# Patient Record
Sex: Female | Born: 1955
Health system: Southern US, Community
[De-identification: ages and names within clinical notes are randomized; demographics above are authoritative.]

## PROBLEM LIST (undated history)

## (undated) DIAGNOSIS — G2581 Restless legs syndrome: Secondary | ICD-10-CM

## (undated) DIAGNOSIS — I10 Essential (primary) hypertension: Secondary | ICD-10-CM

## (undated) DIAGNOSIS — T7840XA Allergy, unspecified, initial encounter: Secondary | ICD-10-CM

## (undated) DIAGNOSIS — R519 Headache, unspecified: Secondary | ICD-10-CM

## (undated) DIAGNOSIS — R51 Headache: Secondary | ICD-10-CM

## (undated) HISTORY — DX: Essential (primary) hypertension: I10

## (undated) HISTORY — DX: Allergy, unspecified, initial encounter: T78.40XA

## (undated) HISTORY — DX: Headache: R51

## (undated) HISTORY — DX: Headache, unspecified: R51.9

## (undated) HISTORY — DX: Restless legs syndrome: G25.81

---

## 2003-03-16 HISTORY — PX: ENDOMETRIAL BIOPSY: SHX622

## 2007-03-16 HISTORY — PX: KNEE ARTHROSCOPY: SUR90

## 2013-05-13 HISTORY — PX: TEMPORAL ARTERY BIOPSY / LIGATION: SUR132

## 2014-03-15 HISTORY — PX: CATARACT EXTRACTION: SUR2

## 2014-07-19 ENCOUNTER — Ambulatory Visit
Admission: RE | Admit: 2014-07-19 | Discharge: 2014-07-19 | Disposition: A | Discharge status: Home / Self Care | Payer: BLUE CROSS/BLUE SHIELD | Source: Ambulatory Visit | Attending: Internal Medicine | Admitting: Internal Medicine

## 2014-07-19 ENCOUNTER — Other Ambulatory Visit: Payer: Self-pay | Admitting: Internal Medicine

## 2014-07-19 DIAGNOSIS — M25552 Pain in left hip: Secondary | ICD-10-CM | POA: Diagnosis present

## 2014-07-19 DIAGNOSIS — M4698 Unspecified inflammatory spondylopathy, sacral and sacrococcygeal region: Secondary | ICD-10-CM | POA: Insufficient documentation

## 2014-10-02 ENCOUNTER — Encounter: Payer: Self-pay | Admitting: Internal Medicine

## 2014-12-19 ENCOUNTER — Other Ambulatory Visit: Payer: Self-pay | Admitting: Internal Medicine

## 2014-12-31 ENCOUNTER — Other Ambulatory Visit: Payer: Self-pay | Admitting: Internal Medicine

## 2014-12-31 ENCOUNTER — Encounter: Payer: Self-pay | Admitting: Internal Medicine

## 2014-12-31 DIAGNOSIS — I10 Essential (primary) hypertension: Secondary | ICD-10-CM | POA: Insufficient documentation

## 2014-12-31 DIAGNOSIS — G4730 Sleep apnea, unspecified: Secondary | ICD-10-CM | POA: Insufficient documentation

## 2014-12-31 DIAGNOSIS — G43009 Migraine without aura, not intractable, without status migrainosus: Secondary | ICD-10-CM | POA: Insufficient documentation

## 2014-12-31 DIAGNOSIS — M179 Osteoarthritis of knee, unspecified: Secondary | ICD-10-CM | POA: Insufficient documentation

## 2014-12-31 DIAGNOSIS — M722 Plantar fascial fibromatosis: Secondary | ICD-10-CM | POA: Insufficient documentation

## 2014-12-31 DIAGNOSIS — E785 Hyperlipidemia, unspecified: Secondary | ICD-10-CM

## 2014-12-31 DIAGNOSIS — R739 Hyperglycemia, unspecified: Secondary | ICD-10-CM

## 2014-12-31 DIAGNOSIS — M25559 Pain in unspecified hip: Secondary | ICD-10-CM | POA: Insufficient documentation

## 2014-12-31 DIAGNOSIS — R42 Dizziness and giddiness: Secondary | ICD-10-CM | POA: Insufficient documentation

## 2014-12-31 DIAGNOSIS — M1710 Unilateral primary osteoarthritis, unspecified knee: Secondary | ICD-10-CM | POA: Insufficient documentation

## 2014-12-31 DIAGNOSIS — T7840XA Allergy, unspecified, initial encounter: Secondary | ICD-10-CM | POA: Insufficient documentation

## 2014-12-31 DIAGNOSIS — R7303 Prediabetes: Secondary | ICD-10-CM | POA: Insufficient documentation

## 2014-12-31 DIAGNOSIS — G4761 Periodic limb movement disorder: Secondary | ICD-10-CM | POA: Insufficient documentation

## 2014-12-31 DIAGNOSIS — I700 Atherosclerosis of aorta: Secondary | ICD-10-CM | POA: Insufficient documentation

## 2015-01-03 ENCOUNTER — Ambulatory Visit: Payer: Self-pay | Admitting: Internal Medicine

## 2015-01-04 ENCOUNTER — Other Ambulatory Visit: Payer: Self-pay | Admitting: Internal Medicine

## 2015-01-08 ENCOUNTER — Encounter: Payer: Self-pay | Admitting: Internal Medicine

## 2015-01-08 ENCOUNTER — Ambulatory Visit (INDEPENDENT_AMBULATORY_CARE_PROVIDER_SITE_OTHER): Payer: BLUE CROSS/BLUE SHIELD | Admitting: Internal Medicine

## 2015-01-08 VITALS — BP 110/60 | HR 68 | Temp 98.1°F | Ht 64.0 in | Wt 227.8 lb

## 2015-01-08 DIAGNOSIS — M170 Bilateral primary osteoarthritis of knee: Secondary | ICD-10-CM | POA: Diagnosis not present

## 2015-01-08 DIAGNOSIS — J019 Acute sinusitis, unspecified: Secondary | ICD-10-CM

## 2015-01-08 DIAGNOSIS — I10 Essential (primary) hypertension: Secondary | ICD-10-CM | POA: Diagnosis not present

## 2015-01-08 MED ORDER — HYDROCHLOROTHIAZIDE 25 MG PO TABS: 25.0000 mg | ORAL_TABLET | Freq: Every day | ORAL | Status: DC

## 2015-01-08 MED ORDER — FEXOFENADINE HCL 180 MG PO TABS: 180.0000 mg | ORAL_TABLET | Freq: Every day | ORAL | Status: DC

## 2015-01-08 MED ORDER — FLUTICASONE PROPIONATE 50 MCG/ACT NA SUSP: 2.0000 | Freq: Every day | NASAL | Status: DC

## 2015-01-08 MED ORDER — AMOXICILLIN-POT CLAVULANATE 875-125 MG PO TABS: 1.0000 | ORAL_TABLET | Freq: Two times a day (BID) | ORAL | Status: DC

## 2015-01-08 NOTE — Progress Notes (Signed)
Date:  01/08/2015   Name:  Alicia Andersen   DOB:  07-20-1955   MRN:  [id number]   Chief Complaint: Hypertension and Sinusitis Hypertension This is a chronic problem. The current episode started more than 1 year ago. The problem is unchanged. The problem is controlled. Pertinent negatives include no chest pain, headaches, palpitations or shortness of breath. Past treatments include diuretics and beta blockers. There are no compliance problems.   Sinusitis This is a new problem. The current episode started in the past 7 days. The problem has been gradually worsening since onset. There has been no fever. Associated symptoms include congestion, ear pain, a hoarse voice, sinus pressure and a sore throat. Pertinent negatives include no chills, coughing, diaphoresis, headaches or shortness of breath.   Knee pain -Patient complains of ongoing bilateral knee pain. She has popping and cracking in the right knee and discomfort in  the left knee with range of motion. She's been seen by orthopedics. They recommended ibuprofen 600 mg twice a day. When she takes that she does get some relief but it does not last. Unfortunately her job requires her to stand for 8 hours on concrete with only a few brief breaks. She does wear steel toed shoes but has cushion inserts. She is asking for additional medications to help control her pain.    Review of Systems  Constitutional: Negative for fever, chills, diaphoresis and fatigue.  HENT: Positive for congestion, ear pain, hoarse voice, postnasal drip, sinus pressure, sore throat and voice change. Negative for hearing loss, nosebleeds and trouble swallowing.   Eyes: Negative for visual disturbance.  Respiratory: Negative for cough, chest tightness and shortness of breath.   Cardiovascular: Positive for leg swelling. Negative for chest pain and palpitations.  Gastrointestinal: Negative for abdominal pain.  Musculoskeletal: Positive for myalgias and arthralgias.  Skin:  Negative for rash.  Neurological: Negative for weakness, light-headedness and headaches.    Patient Active Problem List   Diagnosis Date Noted  . Plantar fasciitis 12/31/2014  . Dyslipidemia 12/31/2014  . Allergic state 12/31/2014  . Essential (primary) hypertension 12/31/2014  . Arthralgia of hip 12/31/2014  . Blood glucose elevated 12/31/2014  . Migraine without aura and responsive to treatment 12/31/2014  . Arthritis of knee, degenerative 12/31/2014  . Periodic limb movement 12/31/2014  . Apnea, sleep 12/31/2014  . Head revolving around 12/31/2014    Prior to Admission medications   Medication Sig Start Date End Date Taking? Authorizing Provider  amitriptyline (ELAVIL) 50 MG tablet Take 1 tablet by mouth at bedtime. 12/14/13  Yes Historical Provider, MD  fexofenadine (ALLEGRA ALLERGY) 180 MG tablet Take by mouth. 07/19/14  Yes Historical Provider, MD  fluticasone (FLONASE) 50 MCG/ACT nasal spray Place into the nose. 06/06/14  Yes Historical Provider, MD  hydrochlorothiazide (HYDRODIURIL) 25 MG tablet TAKE ONE TABLET BY MOUTH ONCE DAILY 01/04/15  Yes Glean Hess, MD  ibuprofen (ADVIL,MOTRIN) 600 MG tablet Take by mouth. 07/19/14  Yes Historical Provider, MD  pramipexole (MIRAPEX) 0.25 MG tablet Take 1 tablet by mouth at bedtime. 12/14/13  Yes Historical Provider, MD  propranolol ER (INDERAL LA) 80 MG 24 hr capsule TAKE ONE CAPSULE BY MOUTH ONCE DAILY 12/19/14  Yes Glean Hess, MD  SUMAtriptan (IMITREX) 100 MG tablet Take 1 tablet by mouth daily as needed. 12/14/13  Yes Historical Provider, MD  meclizine (ANTIVERT) 25 MG tablet Take by mouth. 02/24/12   Historical Provider, MD    No Known Allergies  Past Surgical History  Procedure Laterality  Date  . Cataract extraction    . Knee arthroscopy Right 2009    Social History  Substance Use Topics  . Smoking status: Never Smoker   . Smokeless tobacco: None  . Alcohol Use: No     Medication list has been reviewed and  updated.   Physical Exam  Constitutional: She appears well-developed and well-nourished.  HENT:  Right Ear: Tympanic membrane is erythematous and retracted.  Left Ear: Tympanic membrane is not erythematous and not retracted.  Nose: Right sinus exhibits maxillary sinus tenderness and frontal sinus tenderness. Left sinus exhibits maxillary sinus tenderness and frontal sinus tenderness.  Mouth/Throat: Uvula is midline and oropharynx is clear and moist. No posterior oropharyngeal erythema.  Neck: Normal range of motion. Neck supple. No thyromegaly present.  Cardiovascular: Normal rate, regular rhythm, normal heart sounds and normal pulses.   Pulmonary/Chest: Effort normal and breath sounds normal. She has no wheezes. She has no rales.  Musculoskeletal: She exhibits no edema.       Right knee: She exhibits no swelling (soft crepitus) and no effusion. Tenderness found.       Left knee: She exhibits decreased range of motion. She exhibits no effusion. Tenderness found.  Psychiatric: Her speech is normal. She exhibits a depressed mood.  Nursing note and vitals reviewed.   BP 110/60 mmHg  Pulse 68  Temp(Src) 98.1 F (36.7 C)  Ht 5\' 4"  (1.626 m)  Wt 227 lb 12.8 oz (103.329 kg)  BMI 39.08 kg/m2  SpO2 96%  Assessment and Plan: 1. Acute sinusitis, recurrence not specified, unspecified location Continue Flonase and Allegra - amoxicillin-clavulanate (AUGMENTIN) 875-125 MG tablet; Take 1 tablet by mouth 2 (two) times daily.  Dispense: 20 tablet; Refill: 0 - fluticasone (FLONASE) 50 MCG/ACT nasal spray; Place 2 sprays into both nostrils daily.  Dispense: 16 g; Refill: 3 - fexofenadine (ALLEGRA ALLERGY) 180 MG tablet; Take 1 tablet (180 mg total) by mouth daily.  Dispense: 30 tablet; Refill: 3  2. Osteoarthritis of both knees, unspecified osteoarthritis type Increase ibuprofen to 600 mg 4 times a day with food Follow-up with orthopedics if symptoms progress  3. Essential  hypertension Controlled on current medication - hydrochlorothiazide (HYDRODIURIL) 25 MG tablet; Take 1 tablet (25 mg total) by mouth daily.  Dispense: 90 tablet; Refill: [place], MD [facility]  01/08/2015

## 2015-04-11 ENCOUNTER — Encounter: Payer: Self-pay | Admitting: Internal Medicine

## 2015-04-11 ENCOUNTER — Ambulatory Visit (INDEPENDENT_AMBULATORY_CARE_PROVIDER_SITE_OTHER): Payer: BLUE CROSS/BLUE SHIELD | Admitting: Internal Medicine

## 2015-04-11 VITALS — BP 120/78 | HR 80 | Ht 64.0 in | Wt 224.0 lb

## 2015-04-11 DIAGNOSIS — R609 Edema, unspecified: Secondary | ICD-10-CM

## 2015-04-11 DIAGNOSIS — M1711 Unilateral primary osteoarthritis, right knee: Secondary | ICD-10-CM

## 2015-04-11 DIAGNOSIS — D239 Other benign neoplasm of skin, unspecified: Secondary | ICD-10-CM

## 2015-04-11 DIAGNOSIS — M25473 Effusion, unspecified ankle: Secondary | ICD-10-CM | POA: Insufficient documentation

## 2015-04-11 DIAGNOSIS — I10 Essential (primary) hypertension: Secondary | ICD-10-CM | POA: Diagnosis not present

## 2015-04-11 NOTE — Progress Notes (Signed)
Date:  04/11/2015   Name:  Alicia Andersen   DOB:  02-16-1956   MRN:  [id number]   Chief Complaint: Leg Swelling and Nevus Hypertension This is a chronic problem. The current episode started more than 1 year ago. The problem is unchanged. The problem is controlled. Pertinent negatives include no chest pain, headaches, palpitations or shortness of breath.  Knee Pain  The pain is present in the right knee. The quality of the pain is described as aching. The pain is moderate. The pain has been fluctuating since onset. Pertinent negatives include no muscle weakness or numbness. The symptoms are aggravated by weight bearing. She has tried NSAIDs and ice for the symptoms. The treatment provided mild relief.   Leg swelling - intermittent edema in the ankles that goes down over night. She denies excess intake of sodium, change in medications, change in activity. Today she does not have any swelling.  Skin lesion - She has a lesion of her anterior chest that caught on her necklace. She noticed bleeding and some tenderness for several days. She thinks it is larger than previously and she is wondering if should be removed.  Review of Systems  Constitutional: Negative for fever, appetite change, fatigue and unexpected weight change.  HENT: Negative for tinnitus and trouble swallowing.   Eyes: Negative for visual disturbance.  Respiratory: Negative for cough, chest tightness and shortness of breath.   Cardiovascular: Positive for leg swelling. Negative for chest pain and palpitations.  Gastrointestinal: Negative for abdominal pain.  Endocrine: Negative for polydipsia and polyuria.  Genitourinary: Negative for dysuria and hematuria.  Musculoskeletal: Positive for arthralgias and gait problem.  Neurological: Negative for tremors, numbness and headaches.  Psychiatric/Behavioral: Negative for dysphoric mood.    Patient Active Problem List   Diagnosis Date Noted  . Plantar fasciitis 12/31/2014  .  Dyslipidemia 12/31/2014  . Allergic state 12/31/2014  . Essential (primary) hypertension 12/31/2014  . Arthralgia of hip 12/31/2014  . Blood glucose elevated 12/31/2014  . Migraine without aura and responsive to treatment 12/31/2014  . Arthritis of knee, degenerative 12/31/2014  . Periodic limb movement 12/31/2014  . Apnea, sleep 12/31/2014  . Head revolving around 12/31/2014    Prior to Admission medications   Medication Sig Start Date End Date Taking? Authorizing Provider  amitriptyline (ELAVIL) 50 MG tablet Take 1 tablet by mouth at bedtime. 12/14/13  Yes Historical Provider, MD  fexofenadine (ALLEGRA ALLERGY) 180 MG tablet Take 1 tablet (180 mg total) by mouth daily. 01/08/15  Yes Glean Hess, MD  fluticasone (FLONASE) 50 MCG/ACT nasal spray Place 2 sprays into both nostrils daily. 01/08/15  Yes Glean Hess, MD  hydrochlorothiazide (HYDRODIURIL) 25 MG tablet Take 1 tablet (25 mg total) by mouth daily. 01/08/15  Yes Glean Hess, MD  ibuprofen (ADVIL,MOTRIN) 600 MG tablet Take by mouth. 07/19/14  Yes Historical Provider, MD  pramipexole (MIRAPEX) 0.25 MG tablet Take 1 tablet by mouth at bedtime. 12/14/13  Yes Historical Provider, MD  propranolol ER (INDERAL LA) 80 MG 24 hr capsule TAKE ONE CAPSULE BY MOUTH ONCE DAILY 12/19/14  Yes Glean Hess, MD  SUMAtriptan (IMITREX) 100 MG tablet Take 1 tablet by mouth daily as needed. 12/14/13  Yes Historical Provider, MD    No Known Allergies  Past Surgical History  Procedure Laterality Date  . Cataract extraction    . Knee arthroscopy Right 2009    Social History  Substance Use Topics  . Smoking status: Never Smoker   .  Smokeless tobacco: None  . Alcohol Use: No     Medication list has been reviewed and updated.   Physical Exam  Constitutional: She is oriented to person, place, and time. She appears well-developed. No distress.  HENT:  Head: Normocephalic and atraumatic.  Cardiovascular: Normal rate, regular  rhythm, normal heart sounds and intact distal pulses.   Pulmonary/Chest: Effort normal and breath sounds normal. No respiratory distress.  Musculoskeletal: She exhibits no edema.       Right knee: She exhibits decreased range of motion and swelling. She exhibits no effusion. Tenderness found.  Neurological: She is alert and oriented to person, place, and time.  Skin: Skin is warm and dry. No rash noted.     Psychiatric: She has a normal mood and affect. Her behavior is normal. Thought content normal.  Nursing note and vitals reviewed.   BP 120/78 mmHg  Pulse 80  Ht 5\' 4"  (1.626 m)  Wt 224 lb (101.606 kg)  BMI 38.43 kg/m2  Assessment and Plan: 1. Essential (primary) hypertension Controlled on current medication - Basic metabolic panel  2. Primary osteoarthritis of right knee Progressive discomfort so will refer to orthopedics - Ambulatory referral to Orthopedic Surgery  3. Ankle edema None apparent today; check TSH and chemistries - TSH  4. Benign neoplasm of skin Benign lesion - patient may consult dermatology for removal if desired   Halina Maidens, MD [facility]  04/11/2015

## 2015-08-13 ENCOUNTER — Encounter: Payer: BLUE CROSS/BLUE SHIELD | Admitting: Internal Medicine

## 2015-08-13 ENCOUNTER — Encounter: Payer: Self-pay | Admitting: Internal Medicine

## 2015-10-01 ENCOUNTER — Ambulatory Visit
Admission: RE | Admit: 2015-10-01 | Discharge: 2015-10-01 | Disposition: A | Discharge status: Home / Self Care | Payer: BLUE CROSS/BLUE SHIELD | Source: Ambulatory Visit | Attending: Internal Medicine | Admitting: Internal Medicine

## 2015-10-01 ENCOUNTER — Encounter: Payer: Self-pay | Admitting: Internal Medicine

## 2015-10-01 ENCOUNTER — Ambulatory Visit (INDEPENDENT_AMBULATORY_CARE_PROVIDER_SITE_OTHER): Payer: BLUE CROSS/BLUE SHIELD | Admitting: Internal Medicine

## 2015-10-01 ENCOUNTER — Ambulatory Visit: Payer: Self-pay

## 2015-10-01 VITALS — BP 112/81 | HR 76 | Resp 16 | Ht 64.0 in | Wt 222.0 lb

## 2015-10-01 DIAGNOSIS — R739 Hyperglycemia, unspecified: Secondary | ICD-10-CM

## 2015-10-01 DIAGNOSIS — Z1159 Encounter for screening for other viral diseases: Secondary | ICD-10-CM

## 2015-10-01 DIAGNOSIS — M8580 Other specified disorders of bone density and structure, unspecified site: Secondary | ICD-10-CM | POA: Diagnosis not present

## 2015-10-01 DIAGNOSIS — Z124 Encounter for screening for malignant neoplasm of cervix: Secondary | ICD-10-CM | POA: Diagnosis not present

## 2015-10-01 DIAGNOSIS — M545 Low back pain, unspecified: Secondary | ICD-10-CM

## 2015-10-01 DIAGNOSIS — Z1239 Encounter for other screening for malignant neoplasm of breast: Secondary | ICD-10-CM | POA: Diagnosis not present

## 2015-10-01 DIAGNOSIS — M5136 Other intervertebral disc degeneration, lumbar region: Secondary | ICD-10-CM | POA: Diagnosis not present

## 2015-10-01 DIAGNOSIS — I700 Atherosclerosis of aorta: Secondary | ICD-10-CM | POA: Insufficient documentation

## 2015-10-01 DIAGNOSIS — Z0000 Encounter for general adult medical examination without abnormal findings: Secondary | ICD-10-CM

## 2015-10-01 DIAGNOSIS — E785 Hyperlipidemia, unspecified: Secondary | ICD-10-CM

## 2015-10-01 DIAGNOSIS — Z114 Encounter for screening for human immunodeficiency virus [HIV]: Secondary | ICD-10-CM | POA: Diagnosis not present

## 2015-10-01 DIAGNOSIS — I10 Essential (primary) hypertension: Secondary | ICD-10-CM | POA: Diagnosis not present

## 2015-10-01 LAB — POCT URINALYSIS DIPSTICK
Bilirubin, UA: NEGATIVE
Glucose, UA: NEGATIVE
Ketones, UA: NEGATIVE
Leukocytes, UA: NEGATIVE
NITRITE UA: NEGATIVE
PH UA: 6
PROTEIN UA: NEGATIVE
RBC UA: NEGATIVE
Spec Grav, UA: 1.01

## 2015-10-01 MED ORDER — CYCLOBENZAPRINE HCL 10 MG PO TABS: 10.0000 mg | ORAL_TABLET | Freq: Every day | ORAL | Status: DC

## 2015-10-01 MED ORDER — PROPRANOLOL HCL ER 80 MG PO CP24: 80.0000 mg | ORAL_CAPSULE | Freq: Every day | ORAL | Status: DC

## 2015-10-01 MED ORDER — SUMATRIPTAN SUCCINATE 100 MG PO TABS: 100.0000 mg | ORAL_TABLET | Freq: Every day | ORAL | Status: DC | PRN

## 2015-10-01 MED ORDER — IBUPROFEN 600 MG PO TABS: 600.0000 mg | ORAL_TABLET | Freq: Three times a day (TID) | ORAL | Status: DC | PRN

## 2015-10-01 NOTE — Patient Instructions (Signed)
Check on last Tetanus shot and last Colonoscopy at [place] Practicing breast self-awareness may pick up problems early, prevent significant medical complications, and possibly save your life. By practicing breast self-awareness, you can become familiar with how your breasts look and feel and if your breasts are changing. This allows you to notice changes early. It can also offer you some reassurance that your breast health is good. One way to learn what is normal for your breasts and whether your breasts are changing is to do a breast self-exam. If you find a lump or something that was not present in the past, it is best to contact your caregiver right away. Other findings that should be evaluated by your caregiver include nipple discharge, especially if it is bloody; skin changes or reddening; areas where the skin seems to be pulled in (retracted); or new lumps and bumps. Breast pain is seldom associated with cancer (malignancy), but should also be evaluated by a caregiver. HOW TO PERFORM A BREAST SELF-EXAM The best time to examine your breasts is 5-7 days after your menstrual period is over. During menstruation, the breasts are lumpier, and it may be more difficult to pick up changes. If you do not menstruate, have reached menopause, or had your uterus removed (hysterectomy), you should examine your breasts at regular intervals, such as monthly. If you are breastfeeding, examine your breasts after a feeding or after using a breast pump. Breast implants do not decrease the risk for lumps or tumors, so continue to perform breast self-exams as recommended. Talk to your caregiver about how to determine the difference between the implant and breast tissue. Also, talk about the amount of pressure you should use during the exam. Over time, you will become more familiar with the variations of your breasts and more comfortable with the exam. A breast self-exam requires you to  remove all your clothes above the waist. 1. Look at your breasts and nipples. Stand in front of a mirror in a room with good lighting. With your hands on your hips, push your hands firmly downward. Look for a difference in shape, contour, and size from one breast to the other (asymmetry). Asymmetry includes puckers, dips, or bumps. Also, look for skin changes, such as reddened or scaly areas on the breasts. Look for nipple changes, such as discharge, dimpling, repositioning, or redness. 2. Carefully feel your breasts. This is best done either in the shower or tub while using soapy water or when flat on your back. Place the arm (on the side of the breast you are examining) above your head. Use the pads (not the fingertips) of your three middle fingers on your opposite hand to feel your breasts. Start in the underarm area and use  inch (2 cm) overlapping circles to feel your breast. Use 3 different levels of pressure (light, medium, and firm pressure) at each circle before moving to the next circle. The light pressure is needed to feel the tissue closest to the skin. The medium pressure will help to feel breast tissue a little deeper, while the firm pressure is needed to feel the tissue close to the ribs. Continue the overlapping circles, moving downward over the breast until you feel your ribs below your breast. Then, move one finger-width towards the center of the body. Continue to use the  inch (2 cm) overlapping circles to feel your breast as you move slowly up toward the collar bone (clavicle) near the base of the neck. Continue  the up and down exam using all 3 pressures until you reach the middle of the chest. Do this with each breast, carefully feeling for lumps or changes. 3.  Keep a written record with breast changes or normal findings for each breast. By writing this information down, you do not need to depend only on memory for size, tenderness, or location. Write down where you are in your menstrual  cycle, if you are still menstruating. Breast tissue can have some lumps or thick tissue. However, see your caregiver if you find anything that concerns you.  SEEK MEDICAL CARE IF:  You see a change in shape, contour, or size of your breasts or nipples.   You see skin changes, such as reddened or scaly areas on the breasts or nipples.   You have an unusual discharge from your nipples.   You feel a new lump or unusually thick areas.    This information is not intended to replace advice given to you by your health care provider. Make sure you discuss any questions you have with your health care provider.   Document Released: 03/01/2005 Document Revised: 02/16/2012 Document Reviewed: 06/16/2011 Elsevier Interactive Patient Education [employer].

## 2015-10-01 NOTE — Progress Notes (Signed)
Date:  10/01/2015   Name:  Alicia Andersen   DOB:  1955-08-21   MRN:  [id number]   Chief Complaint: Annual Exam [name] is a 60 y.o. female who presents today for her Complete Annual Exam. She feels fairly well. She reports exercising none. She reports she is sleeping fairly well. She is due for Mammogram.  She has had a colonoscopy at [facility] but she does not know the date.  She also had a TDaP in the past 10 years.   Hypertension This is a chronic problem. The current episode started more than 1 year ago. The problem is unchanged. The problem is controlled. Associated symptoms include headaches. Pertinent negatives include no chest pain, neck pain, palpitations or shortness of breath. Past treatments include beta blockers and diuretics.  Migraine  This is a recurrent problem. The problem has been unchanged. The pain quality is similar to prior headaches. Pertinent negatives include no abdominal pain, coughing, dizziness, fever, hearing loss, neck pain, numbness, tinnitus or vomiting. Her past medical history is significant for hypertension.   Low back pain - bilateral lumbar pain and stiffness.  Worse after work and in the AM.  Taking Advil tid.  Not using a muscle relaxant.  She denies any LE sx, loss of bowel or bladder control, weakness, numbness.  She has never had back xrays.  She works on her feet all day on concrete, wearing steel toed shoes.   Review of Systems  Constitutional: Negative for fever, chills, appetite change, fatigue and unexpected weight change.  HENT: Negative for congestion, hearing loss, tinnitus, trouble swallowing and voice change.   Eyes: Positive for visual disturbance.  Respiratory: Negative for cough, chest tightness, shortness of breath and wheezing.   Cardiovascular: Positive for leg swelling. Negative for chest pain and palpitations.  Gastrointestinal: Negative for vomiting, abdominal pain, diarrhea and constipation.  Endocrine:  Negative for polydipsia and polyuria.  Genitourinary: Negative for dysuria, frequency, hematuria, vaginal bleeding, vaginal discharge and genital sores.  Musculoskeletal: Positive for arthralgias and gait problem. Negative for joint swelling, neck pain and neck stiffness.  Skin: Positive for rash. Negative for color change.  Neurological: Positive for headaches. Negative for dizziness, tremors, light-headedness and numbness.  Hematological: Negative for adenopathy. Does not bruise/bleed easily.  Psychiatric/Behavioral: Negative for sleep disturbance and dysphoric mood. The patient is not nervous/anxious.     Patient Active Problem List   Diagnosis Date Noted  . Ankle edema 04/11/2015  . Plantar fasciitis 12/31/2014  . Dyslipidemia 12/31/2014  . Allergic state 12/31/2014  . Essential (primary) hypertension 12/31/2014  . Arthralgia of hip 12/31/2014  . Blood glucose elevated 12/31/2014  . Migraine without aura and responsive to treatment 12/31/2014  . Arthritis of knee, degenerative 12/31/2014  . Periodic limb movement 12/31/2014  . Apnea, sleep 12/31/2014    Prior to Admission medications   Medication Sig Start Date End Date Taking? Authorizing Provider  amitriptyline (ELAVIL) 50 MG tablet Take 1 tablet by mouth at bedtime. 12/14/13   Historical Provider, MD  fexofenadine (ALLEGRA ALLERGY) 180 MG tablet Take 1 tablet (180 mg total) by mouth daily. 01/08/15   Glean Hess, MD  fluticasone (FLONASE) 50 MCG/ACT nasal spray Place 2 sprays into both nostrils daily. 01/08/15   Glean Hess, MD  hydrochlorothiazide (HYDRODIURIL) 25 MG tablet Take 1 tablet (25 mg total) by mouth daily. 01/08/15   Glean Hess, MD  ibuprofen (ADVIL,MOTRIN) 600 MG tablet Take by mouth. 07/19/14   Historical Provider,  MD  pramipexole (MIRAPEX) 0.25 MG tablet Take 1 tablet by mouth at bedtime. 12/14/13   Historical Provider, MD  propranolol ER (INDERAL LA) 80 MG 24 hr capsule TAKE ONE CAPSULE BY MOUTH ONCE  DAILY 12/19/14   Glean Hess, MD  SUMAtriptan (IMITREX) 100 MG tablet Take 1 tablet by mouth daily as needed. 12/14/13   Historical Provider, MD    No Known Allergies  Past Surgical History  Procedure Laterality Date  . Cataract extraction Left 2016  . Knee arthroscopy Right 2009  . Temporal artery biopsy / ligation  05/2013    negative    Social History  Substance Use Topics  . Smoking status: Never Smoker   . Smokeless tobacco: None  . Alcohol Use: No     Medication list has been reviewed and updated.   Physical Exam  Constitutional: She is oriented to person, place, and time. She appears well-developed and well-nourished. No distress.  HENT:  Head: Normocephalic and atraumatic.  Right Ear: Tympanic membrane and ear canal normal.  Left Ear: Tympanic membrane and ear canal normal.  Nose: Right sinus exhibits no maxillary sinus tenderness. Left sinus exhibits no maxillary sinus tenderness.  Mouth/Throat: Uvula is midline and oropharynx is clear and moist.  Eyes: Conjunctivae and EOM are normal. Right eye exhibits no discharge. Left eye exhibits no discharge. No scleral icterus.  Neck: Normal range of motion. Carotid bruit is not present. No erythema present. No thyromegaly present.  Cardiovascular: Normal rate, regular rhythm, normal heart sounds and normal pulses.   Pulmonary/Chest: Effort normal. No respiratory distress. She has no wheezes. Right breast exhibits no mass, no nipple discharge, no skin change and no tenderness. Left breast exhibits no mass, no nipple discharge, no skin change and no tenderness.  Abdominal: Soft. Bowel sounds are normal. There is no hepatosplenomegaly. There is no tenderness. There is no CVA tenderness.  Genitourinary: Vagina normal and uterus normal. There is no tenderness, lesion or injury on the right labia. There is no tenderness, lesion or injury on the left labia. Cervix exhibits discharge. Cervix exhibits no motion tenderness and no  friability. Right adnexum displays no mass, no tenderness and no fullness. Left adnexum displays no mass, no tenderness and no fullness.  Musculoskeletal: Normal range of motion.       Lumbar back: She exhibits tenderness. She exhibits no spasm.  Lymphadenopathy:    She has no cervical adenopathy.    She has no axillary adenopathy.  Neurological: She is alert and oriented to person, place, and time. She has normal strength and normal reflexes. No cranial nerve deficit or sensory deficit.  Skin: Skin is warm, dry and intact. No rash noted.     Psychiatric: She has a normal mood and affect. Her speech is normal and behavior is normal. Thought content normal.  Nursing note and vitals reviewed.   BP 112/81 mmHg  Pulse 76  Resp 16  Ht 5\' 4"  (1.626 m)  Wt 222 lb (100.699 kg)  BMI 38.09 kg/m2  SpO2 100%  Assessment and Plan: 1. Annual physical exam Normal exam - patient will check on dates of Tetanus and Colonoscopy - POCT urinalysis dipstick  2. Breast cancer screening - MM DIGITAL SCREENING BILATERAL; Future  3. Encounter for screening for cervical cancer  - Pap IG and HPV (high risk) DNA detection  4. Bilateral low back pain without sciatica Continue Advil - cyclobenzaprine (FLEXERIL) 10 MG tablet; Take 1 tablet (10 mg total) by mouth at bedtime.  Dispense:  30 tablet; Refill: 5 - ibuprofen (ADVIL,MOTRIN) 600 MG tablet; Take 1 tablet (600 mg total) by mouth every 8 (eight) hours as needed.  Dispense: 90 tablet; Refill: 5 - DG Lumbar Spine Complete; Future  5. Essential (primary) hypertension controlled - CBC with Differential/Platelet - TSH  6. Need for hepatitis C screening test - Hepatitis C antibody  7. Blood glucose elevated - Comprehensive metabolic panel - Hemoglobin A1c  8. Dyslipidemia Will advise if medication is needed - Lipid panel  9. Encounter for screening for HIV - HIV antibody   Halina Maidens, MD [facility]  10/01/2015

## 2015-10-02 LAB — CBC WITH DIFFERENTIAL/PLATELET
BASOS ABS: 0 10*3/uL (ref 0.0–0.2)
Basos: 0 %
EOS (ABSOLUTE): 0.2 10*3/uL (ref 0.0–0.4)
Eos: 3 %
Hematocrit: 40 % (ref 34.0–46.6)
Hemoglobin: 12.7 g/dL (ref 11.1–15.9)
Immature Grans (Abs): 0 10*3/uL (ref 0.0–0.1)
Immature Granulocytes: 0 %
LYMPHS ABS: 2.5 10*3/uL (ref 0.7–3.1)
LYMPHS: 31 %
MCH: 27.5 pg (ref 26.6–33.0)
MCHC: 31.8 g/dL (ref 31.5–35.7)
MCV: 87 fL (ref 79–97)
Monocytes Absolute: 0.5 10*3/uL (ref 0.1–0.9)
Monocytes: 6 %
NEUTROS ABS: 4.8 10*3/uL (ref 1.4–7.0)
Neutrophils: 60 %
PLATELETS: 262 10*3/uL (ref 150–379)
RBC: 4.61 x10E6/uL (ref 3.77–5.28)
RDW: 14.3 % (ref 12.3–15.4)
WBC: 8 10*3/uL (ref 3.4–10.8)

## 2015-10-02 LAB — COMPREHENSIVE METABOLIC PANEL
ALT: 9 IU/L (ref 0–32)
AST: 12 IU/L (ref 0–40)
Albumin/Globulin Ratio: 1.5 (ref 1.2–2.2)
Albumin: 4.3 g/dL (ref 3.5–5.5)
Alkaline Phosphatase: 103 IU/L (ref 39–117)
BILIRUBIN TOTAL: 0.6 mg/dL (ref 0.0–1.2)
BUN/Creatinine Ratio: 21 (ref 9–23)
BUN: 17 mg/dL (ref 6–24)
CHLORIDE: 102 mmol/L (ref 96–106)
CO2: 28 mmol/L (ref 18–29)
Calcium: 9.9 mg/dL (ref 8.7–10.2)
Creatinine, Ser: 0.8 mg/dL (ref 0.57–1.00)
GFR calc non Af Amer: 82 mL/min/{1.73_m2} (ref >59)
GFR, EST AFRICAN AMERICAN: 94 mL/min/{1.73_m2} (ref >59)
GLUCOSE: 77 mg/dL (ref 65–99)
Globulin, Total: 2.8 g/dL (ref 1.5–4.5)
Potassium: 3.6 mmol/L (ref 3.5–5.2)
Sodium: 145 mmol/L — ABNORMAL HIGH (ref 134–144)
TOTAL PROTEIN: 7.1 g/dL (ref 6.0–8.5)

## 2015-10-02 LAB — LIPID PANEL
CHOLESTEROL TOTAL: 257 mg/dL — AB (ref 100–199)
Chol/HDL Ratio: 3.5 ratio units (ref 0.0–4.4)
HDL: 74 mg/dL (ref >39)
LDL Calculated: 161 mg/dL — ABNORMAL HIGH (ref 0–99)
Triglycerides: 110 mg/dL (ref 0–149)
VLDL CHOLESTEROL CAL: 22 mg/dL (ref 5–40)

## 2015-10-02 LAB — HEPATITIS C ANTIBODY

## 2015-10-02 LAB — TSH: TSH: 1.42 u[IU]/mL (ref 0.450–4.500)

## 2015-10-02 LAB — HEMOGLOBIN A1C
ESTIMATED AVERAGE GLUCOSE: 123 mg/dL
HEMOGLOBIN A1C: 5.9 % — AB (ref 4.8–5.6)

## 2015-10-02 LAB — HIV ANTIBODY (ROUTINE TESTING W REFLEX): HIV Screen 4th Generation wRfx: NONREACTIVE

## 2015-10-09 LAB — PAP IG AND HPV HIGH-RISK: PAP SMEAR COMMENT: 0

## 2015-10-09 LAB — HPV, LOW VOLUME (REFLEX): HPV low volume reflex: NEGATIVE

## 2016-03-01 ENCOUNTER — Other Ambulatory Visit: Payer: Self-pay | Admitting: Internal Medicine

## 2016-03-01 DIAGNOSIS — I10 Essential (primary) hypertension: Secondary | ICD-10-CM

## 2016-03-29 ENCOUNTER — Ambulatory Visit (INDEPENDENT_AMBULATORY_CARE_PROVIDER_SITE_OTHER): Payer: BLUE CROSS/BLUE SHIELD | Admitting: Internal Medicine

## 2016-03-29 ENCOUNTER — Encounter: Payer: Self-pay | Admitting: Internal Medicine

## 2016-03-29 VITALS — BP 118/82 | HR 82 | Temp 98.6°F | Ht 64.0 in | Wt 229.0 lb

## 2016-03-29 DIAGNOSIS — G5762 Lesion of plantar nerve, left lower limb: Secondary | ICD-10-CM | POA: Diagnosis not present

## 2016-03-29 NOTE — Progress Notes (Signed)
Date:  03/29/2016   Name:  Alicia Andersen   DOB:  1956/02/24   MRN:  [id number]   Chief Complaint: foot pain (Pt stated ball of the foot is painful for 3 weeks.) Foot Injury   There was no injury mechanism. The pain is mild. The symptoms are aggravated by movement and weight bearing. She has tried NSAIDs for the symptoms. The treatment provided no relief.      Review of Systems  Constitutional: Negative for chills and fatigue.  Respiratory: Negative for chest tightness and stridor.   Cardiovascular: Negative for chest pain.  Musculoskeletal: Positive for arthralgias (left foot pain).    Patient Active Problem List   Diagnosis Date Noted  . Ankle edema 04/11/2015  . Plantar fasciitis 12/31/2014  . Dyslipidemia 12/31/2014  . Allergic state 12/31/2014  . Essential (primary) hypertension 12/31/2014  . Arthralgia of hip 12/31/2014  . Blood glucose elevated 12/31/2014  . Migraine without aura and responsive to treatment 12/31/2014  . Arthritis of knee, degenerative 12/31/2014  . Periodic limb movement 12/31/2014  . Apnea, sleep 12/31/2014    Prior to Admission medications   Medication Sig Start Date End Date Taking? Authorizing Provider  acetaminophen (TYLENOL) 500 MG tablet Take by mouth.    Historical Provider, MD  amitriptyline (ELAVIL) 50 MG tablet Take 1 tablet by mouth at bedtime. 12/14/13   Historical Provider, MD  cyclobenzaprine (FLEXERIL) 10 MG tablet Take 1 tablet (10 mg total) by mouth at bedtime. 10/01/15   Glean Hess, MD  fexofenadine [facility] ALLERGY) 180 MG tablet Take 1 tablet (180 mg total) by mouth daily. 01/08/15   Glean Hess, MD  fluticasone (FLONASE) 50 MCG/ACT nasal spray Place 2 sprays into both nostrils daily. 01/08/15   Glean Hess, MD  hydrochlorothiazide (HYDRODIURIL) 25 MG tablet TAKE ONE TABLET BY MOUTH ONCE DAILY 03/01/16   Glean Hess, MD  ibuprofen (ADVIL,MOTRIN) 600 MG tablet Take 1 tablet (600 mg total) by mouth every 8  (eight) hours as needed. 10/01/15   Glean Hess, MD  pramipexole (MIRAPEX) 0.25 MG tablet Take 1 tablet by mouth at bedtime. 12/14/13   Historical Provider, MD  propranolol ER (INDERAL LA) 80 MG 24 hr capsule Take 1 capsule (80 mg total) by mouth daily. 10/01/15   Glean Hess, MD  SUMAtriptan (IMITREX) 100 MG tablet Take 1 tablet (100 mg total) by mouth daily as needed. 10/01/15   Glean Hess, MD    No Known Allergies  Past Surgical History:  Procedure Laterality Date  . CATARACT EXTRACTION Left 2016  . KNEE ARTHROSCOPY Right 2009  . TEMPORAL ARTERY BIOPSY / LIGATION  05/2013   negative    Social History  Substance Use Topics  . Smoking status: Never Smoker  . Smokeless tobacco: Not on file  . Alcohol use No     Medication list has been reviewed and updated.   Physical Exam  Constitutional: She is oriented to person, place, and time. She appears well-developed. No distress.  HENT:  Head: Normocephalic and atraumatic.  Cardiovascular: Normal rate, regular rhythm and normal heart sounds.   Pulmonary/Chest: Effort normal and breath sounds normal. No respiratory distress.  Musculoskeletal: Normal range of motion.       Feet:  Neurological: She is alert and oriented to person, place, and time.  Skin: Skin is warm and dry. No rash noted.  Psychiatric: She has a normal mood and affect. Her behavior is normal. Thought content normal.  Nursing  note and vitals reviewed.   BP 118/82   Pulse 82   Temp 98.6 F (37 C)   Ht 5\' 4"  (1.626 m)   Wt 229 lb (103.9 kg)   SpO2 98%   BMI 39.31 kg/m   Assessment and Plan: 1. 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

## 2016-03-29 NOTE — Patient Instructions (Signed)
Continue Ibuprofen 

## 2016-10-01 ENCOUNTER — Encounter: Payer: Self-pay | Admitting: Internal Medicine

## 2016-10-01 ENCOUNTER — Ambulatory Visit (INDEPENDENT_AMBULATORY_CARE_PROVIDER_SITE_OTHER): Payer: BLUE CROSS/BLUE SHIELD | Admitting: Internal Medicine

## 2016-10-01 VITALS — BP 124/82 | HR 68 | Ht 64.0 in | Wt 220.0 lb

## 2016-10-01 DIAGNOSIS — R739 Hyperglycemia, unspecified: Secondary | ICD-10-CM

## 2016-10-01 DIAGNOSIS — E785 Hyperlipidemia, unspecified: Secondary | ICD-10-CM

## 2016-10-01 DIAGNOSIS — R42 Dizziness and giddiness: Secondary | ICD-10-CM | POA: Insufficient documentation

## 2016-10-01 DIAGNOSIS — G43009 Migraine without aura, not intractable, without status migrainosus: Secondary | ICD-10-CM

## 2016-10-01 DIAGNOSIS — Z1239 Encounter for other screening for malignant neoplasm of breast: Secondary | ICD-10-CM

## 2016-10-01 DIAGNOSIS — I10 Essential (primary) hypertension: Secondary | ICD-10-CM

## 2016-10-01 DIAGNOSIS — Z0000 Encounter for general adult medical examination without abnormal findings: Secondary | ICD-10-CM

## 2016-10-01 DIAGNOSIS — M545 Low back pain, unspecified: Secondary | ICD-10-CM

## 2016-10-01 LAB — POCT URINALYSIS DIPSTICK
Bilirubin, UA: NEGATIVE
Blood, UA: NEGATIVE
GLUCOSE UA: NEGATIVE
Ketones, UA: NEGATIVE
NITRITE UA: NEGATIVE
PROTEIN UA: NEGATIVE
SPEC GRAV UA: 1.01 (ref 1.010–1.025)
UROBILINOGEN UA: 0.2 U/dL
pH, UA: 5 (ref 5.0–8.0)

## 2016-10-01 MED ORDER — PROPRANOLOL HCL ER 80 MG PO CP24: 80.0000 mg | ORAL_CAPSULE | Freq: Every day | ORAL | 12 refills | Status: DC

## 2016-10-01 MED ORDER — HYDROCHLOROTHIAZIDE 25 MG PO TABS: 25.0000 mg | ORAL_TABLET | Freq: Every day | ORAL | 3 refills | Status: DC

## 2016-10-01 MED ORDER — IBUPROFEN 600 MG PO TABS: 600.0000 mg | ORAL_TABLET | Freq: Three times a day (TID) | ORAL | 5 refills | Status: DC | PRN

## 2016-10-01 NOTE — Patient Instructions (Signed)
Health Maintenance  Topic Date Due  . TETANUS/TDAP  05/13/1976  . COLONOSCOPY  05/14/2007  . INFLUENZA VACCINE  10/13/2016  . MAMMOGRAM  12/02/2016  . PAP SMEAR  10/01/2018  . Hepatitis C Screening  Completed  . HIV Screening  Completed

## 2016-10-01 NOTE — Progress Notes (Signed)
Date:  10/01/2016   Name:  Alicia Andersen   DOB:  07-26-1955   MRN:  [id number]   Chief Complaint: Annual Exam (Breast Exam) Alicia Andersen is a 61 y.o. female who presents today for her Complete Annual Exam. She feels fairly well. She reports exercising little - some walking. She reports she is sleeping fairly well.   She is having more trouble with dizziness - has hx of inner ear issues.  No falls as a result.  No nausea/vomiting.  Headaches are unchanged.  Episodes of dizziness can last all day - meclizine did not help much.  She has had some sort of work up in the past - not sure if it was ENT or Neurology.   Hypertension  This is a chronic problem. The problem is controlled. Pertinent negatives include no chest pain, headaches, palpitations or shortness of breath. Past treatments include diuretics and beta blockers. The current treatment provides significant improvement.  Hyperlipidemia  This is a chronic problem. Recent lipid tests were reviewed and are variable. Pertinent negatives include no chest pain or shortness of breath. She is currently on no antihyperlipidemic treatment.  Migraine   This is a recurrent problem. Pertinent negatives include no abdominal pain, coughing, dizziness, fever, hearing loss, tinnitus or vomiting. Her past medical history is significant for hypertension.      Review of Systems  Constitutional: Negative for chills, fatigue and fever.  HENT: Negative for congestion, hearing loss, tinnitus, trouble swallowing and voice change.   Eyes: Negative for visual disturbance.  Respiratory: Negative for cough, chest tightness, shortness of breath and wheezing.   Cardiovascular: Positive for leg swelling. Negative for chest pain and palpitations.  Gastrointestinal: Negative for abdominal pain, constipation, diarrhea and vomiting.  Endocrine: Negative for polydipsia and polyuria.  Genitourinary: Negative for dysuria, frequency, genital sores, vaginal bleeding  and vaginal discharge.  Musculoskeletal: Positive for arthralgias and gait problem. Negative for joint swelling.  Skin: Negative for color change and rash.  Neurological: Negative for dizziness, tremors, light-headedness and headaches.  Hematological: Negative for adenopathy. Does not bruise/bleed easily.  Psychiatric/Behavioral: Positive for sleep disturbance. Negative for dysphoric mood. The patient is not nervous/anxious.     Patient Active Problem List   Diagnosis Date Noted  . Ankle edema 04/11/2015  . Plantar fasciitis 12/31/2014  . Dyslipidemia 12/31/2014  . Allergic state 12/31/2014  . Essential (primary) hypertension 12/31/2014  . Arthralgia of hip 12/31/2014  . Blood glucose elevated 12/31/2014  . Migraine without aura and responsive to treatment 12/31/2014  . Arthritis of knee, degenerative 12/31/2014  . Periodic limb movement 12/31/2014  . Apnea, sleep 12/31/2014    Prior to Admission medications   Medication Sig Start Date End Date Taking? Authorizing Provider  acetaminophen (TYLENOL) 500 MG tablet Take by mouth.   Yes [provider]  amitriptyline (ELAVIL) 50 MG tablet Take 1 tablet by mouth at bedtime. 12/14/13  Yes [provider]  cyclobenzaprine (FLEXERIL) 10 MG tablet Take 1 tablet (10 mg total) by mouth at bedtime. 10/01/15  Yes Glean Hess, MD  fexofenadine [facility] ALLERGY) 180 MG tablet Take 1 tablet (180 mg total) by mouth daily. 01/08/15  Yes Glean Hess, MD  hydrochlorothiazide (HYDRODIURIL) 25 MG tablet TAKE ONE TABLET BY MOUTH ONCE DAILY 03/01/16  Yes Glean Hess, MD  ibuprofen (ADVIL,MOTRIN) 600 MG tablet Take 1 tablet (600 mg total) by mouth every 8 (eight) hours as needed. 10/01/15  Yes Glean Hess, MD  pramipexole (MIRAPEX) 0.25  MG tablet Take 1 tablet by mouth at bedtime. 12/14/13  Yes [provider]  propranolol ER (INDERAL LA) 80 MG 24 hr capsule Take 1 capsule (80 mg total) by mouth daily. 10/01/15   Yes Glean Hess, MD  SUMAtriptan (IMITREX) 100 MG tablet Take 1 tablet (100 mg total) by mouth daily as needed. 10/01/15  Yes Glean Hess, MD  fluticasone [facility]) 50 MCG/ACT nasal spray Place 2 sprays into both nostrils daily. Patient not taking: Reported on 10/01/2016 01/08/15   Glean Hess, MD    No Known Allergies  Past Surgical History:  Procedure Laterality Date  . CATARACT EXTRACTION Left 2016  . KNEE ARTHROSCOPY Right 2009  . TEMPORAL ARTERY BIOPSY / LIGATION  05/2013   negative    Social History  Substance Use Topics  . Smoking status: Never Smoker  . Smokeless tobacco: Never Used  . Alcohol use No   Depression screen [facility] 2/9 10/01/2016 10/01/2015 04/11/2015  Decreased Interest 0 0 0  Down, Depressed, Hopeless 0 0 0  PHQ - 2 Score 0 0 0     Medication list has been reviewed and updated.   Physical Exam  Constitutional: She is oriented to person, place, and time. She appears well-developed and well-nourished. No distress.  HENT:  Head: Normocephalic and atraumatic.  Right Ear: Tympanic membrane and ear canal normal.  Left Ear: Tympanic membrane and ear canal normal.  Nose: Right sinus exhibits no maxillary sinus tenderness. Left sinus exhibits no maxillary sinus tenderness.  Mouth/Throat: Uvula is midline and oropharynx is clear and moist.  Eyes: Conjunctivae and EOM are normal. Right eye exhibits no discharge. Left eye exhibits no discharge. No scleral icterus.  Neck: Normal range of motion. Carotid bruit is not present. No erythema present. No thyromegaly present.  Cardiovascular: Normal rate, regular rhythm, normal heart sounds and normal pulses.   Pulmonary/Chest: Effort normal. No respiratory distress. She has no wheezes. Right breast exhibits no mass, no nipple discharge, no skin change and no tenderness. Left breast exhibits no mass, no nipple discharge, no skin change and no tenderness.  Abdominal: Soft. Bowel sounds are normal. There is no  hepatosplenomegaly. There is no tenderness. There is no CVA tenderness.  Musculoskeletal: She exhibits no edema or tenderness.  Lymphadenopathy:    She has no cervical adenopathy.    She has no axillary adenopathy.  Neurological: She is alert and oriented to person, place, and time. She has normal reflexes. No cranial nerve deficit or sensory deficit.  Skin: Skin is warm, dry and intact. No rash noted.  Psychiatric: She has a normal mood and affect. Her speech is normal and behavior is normal. Thought content normal.  Nursing note and vitals reviewed.   BP 124/82   Pulse 68   Ht 5\' 4"  (1.626 m)   Wt 220 lb (99.8 kg)   SpO2 99%   BMI 37.76 kg/m   Assessment and Plan: 1. Annual physical exam Check on colonoscopy - POCT urinalysis dipstick  2. Breast cancer screening At [facility] - MM DIGITAL SCREENING BILATERAL  3. Migraine without aura and responsive to treatment Stable sx - propranolol ER (INDERAL LA) 80 MG 24 hr capsule; Take 1 capsule (80 mg total) by mouth daily.  Dispense: 30 capsule; Refill: 12  4. Dyslipidemia Will advise on medication - Lipid panel  5. Blood glucose elevated Limit intake of sweet beverages - Hemoglobin A1c  6. Bilateral low back pain without sciatica, unspecified chronicity With knee and foot pain -  followed by Podiatry and Ortho - ibuprofen (ADVIL,MOTRIN) 600 MG tablet; Take 1 tablet (600 mg total) by mouth every 8 (eight) hours as needed.  Dispense: 90 tablet; Refill: 5  7. Essential hypertension controlled - CBC with Differential/Platelet - Comprehensive metabolic panel - TSH - hydrochlorothiazide (HYDRODIURIL) 25 MG tablet; Take 1 tablet (25 mg total) by mouth daily.  Dispense: 90 tablet; Refill: 3  8. Vertigo Unable to go to [place]  Will look into [facility] or [facility] - Ambulatory referral to ENT   Meds ordered this encounter  Medications  . propranolol ER (INDERAL LA) 80 MG 24 hr capsule    Sig: Take 1 capsule (80 mg total) by mouth daily.     Dispense:  30 capsule    Refill:  12  . ibuprofen (ADVIL,MOTRIN) 600 MG tablet    Sig: Take 1 tablet (600 mg total) by mouth every 8 (eight) hours as needed.    Dispense:  90 tablet    Refill:  5  . hydrochlorothiazide (HYDRODIURIL) 25 MG tablet    Sig: Take 1 tablet (25 mg total) by mouth daily.    Dispense:  90 tablet    Refill:  [place], MD [facility]  10/01/2016

## 2016-10-02 LAB — COMPREHENSIVE METABOLIC PANEL
A/G RATIO: 1.6 (ref 1.2–2.2)
ALK PHOS: 102 IU/L (ref 39–117)
ALT: 10 IU/L (ref 0–32)
AST: 11 IU/L (ref 0–40)
Albumin: 4.5 g/dL (ref 3.5–5.5)
BILIRUBIN TOTAL: 0.7 mg/dL (ref 0.0–1.2)
BUN / CREAT RATIO: 19 (ref 9–23)
BUN: 13 mg/dL (ref 6–24)
CHLORIDE: 102 mmol/L (ref 96–106)
CO2: 26 mmol/L (ref 20–29)
Calcium: 10.5 mg/dL — ABNORMAL HIGH (ref 8.7–10.2)
Creatinine, Ser: 0.68 mg/dL (ref 0.57–1.00)
GFR calc non Af Amer: 96 mL/min/{1.73_m2} (ref >59)
GFR, EST AFRICAN AMERICAN: 111 mL/min/{1.73_m2} (ref >59)
Globulin, Total: 2.9 g/dL (ref 1.5–4.5)
Glucose: 76 mg/dL (ref 65–99)
POTASSIUM: 3.7 mmol/L (ref 3.5–5.2)
Sodium: 146 mmol/L — ABNORMAL HIGH (ref 134–144)
TOTAL PROTEIN: 7.4 g/dL (ref 6.0–8.5)

## 2016-10-02 LAB — LIPID PANEL
CHOLESTEROL TOTAL: 257 mg/dL — AB (ref 100–199)
Chol/HDL Ratio: 3.6 ratio (ref 0.0–4.4)
HDL: 71 mg/dL (ref >39)
LDL Calculated: 159 mg/dL — ABNORMAL HIGH (ref 0–99)
Triglycerides: 135 mg/dL (ref 0–149)
VLDL Cholesterol Cal: 27 mg/dL (ref 5–40)

## 2016-10-02 LAB — CBC WITH DIFFERENTIAL/PLATELET
BASOS ABS: 0 10*3/uL (ref 0.0–0.2)
BASOS: 0 %
EOS (ABSOLUTE): 0.3 10*3/uL (ref 0.0–0.4)
Eos: 4 %
Hematocrit: 40.7 % (ref 34.0–46.6)
Hemoglobin: 13.1 g/dL (ref 11.1–15.9)
IMMATURE GRANS (ABS): 0 10*3/uL (ref 0.0–0.1)
Immature Granulocytes: 0 %
LYMPHS ABS: 2.6 10*3/uL (ref 0.7–3.1)
Lymphs: 37 %
MCH: 27.2 pg (ref 26.6–33.0)
MCHC: 32.2 g/dL (ref 31.5–35.7)
MCV: 84 fL (ref 79–97)
MONOS ABS: 0.5 10*3/uL (ref 0.1–0.9)
Monocytes: 7 %
NEUTROS ABS: 3.8 10*3/uL (ref 1.4–7.0)
Neutrophils: 52 %
PLATELETS: 266 10*3/uL (ref 150–379)
RBC: 4.82 x10E6/uL (ref 3.77–5.28)
RDW: 14.6 % (ref 12.3–15.4)
WBC: 7.2 10*3/uL (ref 3.4–10.8)

## 2016-10-02 LAB — HEMOGLOBIN A1C
ESTIMATED AVERAGE GLUCOSE: 131 mg/dL
HEMOGLOBIN A1C: 6.2 % — AB (ref 4.8–5.6)

## 2016-10-02 LAB — TSH: TSH: 2.12 u[IU]/mL (ref 0.450–4.500)

## 2017-10-04 ENCOUNTER — Ambulatory Visit (INDEPENDENT_AMBULATORY_CARE_PROVIDER_SITE_OTHER): Payer: BLUE CROSS/BLUE SHIELD | Admitting: Internal Medicine

## 2017-10-04 ENCOUNTER — Encounter: Payer: Self-pay | Admitting: Internal Medicine

## 2017-10-04 ENCOUNTER — Other Ambulatory Visit (HOSPITAL_COMMUNITY)
Admission: RE | Admit: 2017-10-04 | Discharge: 2017-10-04 | Disposition: A | Discharge status: Home / Self Care | Payer: BLUE CROSS/BLUE SHIELD | Source: Ambulatory Visit | Attending: Internal Medicine | Admitting: Internal Medicine

## 2017-10-04 VITALS — BP 118/62 | HR 82 | Ht 64.0 in | Wt 214.0 lb

## 2017-10-04 DIAGNOSIS — Z0001 Encounter for general adult medical examination with abnormal findings: Secondary | ICD-10-CM

## 2017-10-04 DIAGNOSIS — Z0000 Encounter for general adult medical examination without abnormal findings: Secondary | ICD-10-CM

## 2017-10-04 DIAGNOSIS — I10 Essential (primary) hypertension: Secondary | ICD-10-CM

## 2017-10-04 DIAGNOSIS — G43009 Migraine without aura, not intractable, without status migrainosus: Secondary | ICD-10-CM

## 2017-10-04 DIAGNOSIS — Z1239 Encounter for other screening for malignant neoplasm of breast: Secondary | ICD-10-CM

## 2017-10-04 DIAGNOSIS — N950 Postmenopausal bleeding: Secondary | ICD-10-CM | POA: Diagnosis present

## 2017-10-04 DIAGNOSIS — M545 Low back pain, unspecified: Secondary | ICD-10-CM

## 2017-10-04 DIAGNOSIS — Z1211 Encounter for screening for malignant neoplasm of colon: Secondary | ICD-10-CM

## 2017-10-04 LAB — POCT URINALYSIS DIPSTICK
BILIRUBIN UA: NEGATIVE
GLUCOSE UA: NEGATIVE
Ketones, UA: NEGATIVE
LEUKOCYTES UA: NEGATIVE
Nitrite, UA: NEGATIVE
PH UA: 6 (ref 5.0–8.0)
Protein, UA: NEGATIVE
SPEC GRAV UA: 1.01 (ref 1.010–1.025)
UROBILINOGEN UA: 0.2 U/dL

## 2017-10-04 MED ORDER — CYCLOBENZAPRINE HCL 10 MG PO TABS: 10.0000 mg | ORAL_TABLET | Freq: Every day | ORAL | 5 refills | Status: DC

## 2017-10-04 MED ORDER — HYDROCHLOROTHIAZIDE 25 MG PO TABS: 25.0000 mg | ORAL_TABLET | Freq: Every day | ORAL | 3 refills | Status: DC

## 2017-10-04 MED ORDER — FEXOFENADINE HCL 180 MG PO TABS: 180.0000 mg | ORAL_TABLET | Freq: Every day | ORAL | 3 refills | Status: DC

## 2017-10-04 MED ORDER — IBUPROFEN 600 MG PO TABS
600.0000 mg | ORAL_TABLET | Freq: Three times a day (TID) | ORAL | 5 refills | Status: DC | PRN
Start: 2017-10-04 — End: 2019-11-20

## 2017-10-04 MED ORDER — PROPRANOLOL HCL ER 80 MG PO CP24: 80.0000 mg | ORAL_CAPSULE | Freq: Every day | ORAL | 12 refills | Status: DC

## 2017-10-04 MED ORDER — AMITRIPTYLINE HCL 50 MG PO TABS: 50.0000 mg | ORAL_TABLET | Freq: Every day | ORAL | 5 refills | Status: DC

## 2017-10-04 MED ORDER — SUMATRIPTAN SUCCINATE 100 MG PO TABS: 100.0000 mg | ORAL_TABLET | Freq: Every day | ORAL | 3 refills | Status: DC | PRN

## 2017-10-04 NOTE — Patient Instructions (Signed)
Health Maintenance for Postmenopausal Women Menopause is a normal process in which your reproductive ability comes to an end. This process happens gradually over a span of months to years, usually between the ages of 22 and 9. Menopause is complete when you have missed 12 consecutive menstrual periods. It is important to talk with your health care provider about some of the most common conditions that affect postmenopausal women, such as heart disease, cancer, and bone loss (osteoporosis). Adopting a healthy lifestyle and getting preventive care can help to promote your health and wellness. Those actions can also lower your chances of developing some of these common conditions. What should I know about menopause? During menopause, you may experience a number of symptoms, such as:  Moderate-to-severe hot flashes.  Night sweats.  Decrease in sex drive.  Mood swings.  Headaches.  Tiredness.  Irritability.  Memory problems.  Insomnia.  Choosing to treat or not to treat menopausal changes is an individual decision that you make with your health care provider. What should I know about hormone replacement therapy and supplements? Hormone therapy products are effective for treating symptoms that are associated with menopause, such as hot flashes and night sweats. Hormone replacement carries certain risks, especially as you become older. If you are thinking about using estrogen or estrogen with progestin treatments, discuss the benefits and risks with your health care provider. What should I know about heart disease and stroke? Heart disease, heart attack, and stroke become more likely as you age. This may be due, in part, to the hormonal changes that your body experiences during menopause. These can affect how your body processes dietary fats, triglycerides, and cholesterol. Heart attack and stroke are both medical emergencies. There are many things that you can do to help prevent heart disease  and stroke:  Have your blood pressure checked at least every 1-2 years. High blood pressure causes heart disease and increases the risk of stroke.  If you are 53-22 years old, ask your health care provider if you should take aspirin to prevent a heart attack or a stroke.  Do not use any tobacco products, including cigarettes, chewing tobacco, or electronic cigarettes. If you need help quitting, ask your health care provider.  It is important to eat a healthy diet and maintain a healthy weight. ? Be sure to include plenty of vegetables, fruits, low-fat dairy products, and lean protein. ? Avoid eating foods that are high in solid fats, added sugars, or salt (sodium).  Get regular exercise. This is one of the most important things that you can do for your health. ? Try to exercise for at least 150 minutes each week. The type of exercise that you do should increase your heart rate and make you sweat. This is known as moderate-intensity exercise. ? Try to do strengthening exercises at least twice each week. Do these in addition to the moderate-intensity exercise.  Know your numbers.Ask your health care provider to check your cholesterol and your blood glucose. Continue to have your blood tested as directed by your health care provider.  What should I know about cancer screening? There are several types of cancer. Take the following steps to reduce your risk and to catch any cancer development as early as possible. Breast Cancer  Practice breast self-awareness. ? This means understanding how your breasts normally appear and feel. ? It also means doing regular breast self-exams. Let your health care provider know about any changes, no matter how small.  If you are 40  or older, have a clinician do a breast exam (clinical breast exam or CBE) every year. Depending on your age, family history, and medical history, it may be recommended that you also have a yearly breast X-ray (mammogram).  If you  have a family history of breast cancer, talk with your health care provider about genetic screening.  If you are at high risk for breast cancer, talk with your health care provider about having an MRI and a mammogram every year.  Breast cancer (BRCA) gene test is recommended for women who have family members with BRCA-related cancers. Results of the assessment will determine the need for genetic counseling and BRCA1 and for BRCA2 testing. BRCA-related cancers include these types: ? Breast. This occurs in males or females. ? Ovarian. ? Tubal. This may also be called fallopian tube cancer. ? Cancer of the abdominal or pelvic lining (peritoneal cancer). ? Prostate. ? Pancreatic.  Cervical, Uterine, and Ovarian Cancer Your health care provider may recommend that you be screened regularly for cancer of the pelvic organs. These include your ovaries, uterus, and vagina. This screening involves a pelvic exam, which includes checking for microscopic changes to the surface of your cervix (Pap test).  For women ages 21-65, health care providers may recommend a pelvic exam and a Pap test every three years. For women ages 43-65, they may recommend the Pap test and pelvic exam, combined with testing for human papilloma virus (HPV), every five years. Some types of HPV increase your risk of cervical cancer. Testing for HPV may also be done on women of any age who have unclear Pap test results.  Other health care providers may not recommend any screening for nonpregnant women who are considered low risk for pelvic cancer and have no symptoms. Ask your health care provider if a screening pelvic exam is right for you.  If you have had past treatment for cervical cancer or a condition that could lead to cancer, you need Pap tests and screening for cancer for at least 20 years after your treatment. If Pap tests have been discontinued for you, your risk factors (such as having a new sexual partner) need to be  reassessed to determine if you should start having screenings again. Some women have medical problems that increase the chance of getting cervical cancer. In these cases, your health care provider may recommend that you have screening and Pap tests more often.  If you have a family history of uterine cancer or ovarian cancer, talk with your health care provider about genetic screening.  If you have vaginal bleeding after reaching menopause, tell your health care provider.  There are currently no reliable tests available to screen for ovarian cancer.  Lung Cancer Lung cancer screening is recommended for adults 75-76 years old who are at high risk for lung cancer because of a history of smoking. A yearly low-dose CT scan of the lungs is recommended if you:  Currently smoke.  Have a history of at least 30 pack-years of smoking and you currently smoke or have quit within the past 15 years. A pack-year is smoking an average of one pack of cigarettes per day for one year.  Yearly screening should:  Continue until it has been 15 years since you quit.  Stop if you develop a health problem that would prevent you from having lung cancer treatment.  Colorectal Cancer  This type of cancer can be detected and can often be prevented.  Routine colorectal cancer screening usually begins at  age 57 and continues through age 40.  If you have risk factors for colon cancer, your health care provider may recommend that you be screened at an earlier age.  If you have a family history of colorectal cancer, talk with your health care provider about genetic screening.  Your health care provider may also recommend using home test kits to check for hidden blood in your stool.  A small camera at the end of a tube can be used to examine your colon directly (sigmoidoscopy or colonoscopy). This is done to check for the earliest forms of colorectal cancer.  Direct examination of the colon should be repeated every  5-10 years until age 2. However, if early forms of precancerous polyps or small growths are found or if you have a family history or genetic risk for colorectal cancer, you may need to be screened more often.  Skin Cancer  Check your skin from head to toe regularly.  Monitor any moles. Be sure to tell your health care provider: ? About any new moles or changes in moles, especially if there is a change in a mole's shape or color. ? If you have a mole that is larger than the size of a pencil eraser.  If any of your family members has a history of skin cancer, especially at a young age, talk with your health care provider about genetic screening.  Always use sunscreen. Apply sunscreen liberally and repeatedly throughout the day.  Whenever you are outside, protect yourself by wearing long sleeves, pants, a wide-brimmed hat, and sunglasses.  What should I know about osteoporosis? Osteoporosis is a condition in which bone destruction happens more quickly than new bone creation. After menopause, you may be at an increased risk for osteoporosis. To help prevent osteoporosis or the bone fractures that can happen because of osteoporosis, the following is recommended:  If you are 20-69 years old, get at least 1,000 mg of calcium and at least 600 mg of vitamin D per day.  If you are older than age 9 but younger than age 70, get at least 1,200 mg of calcium and at least 600 mg of vitamin D per day.  If you are older than age 51, get at least 1,200 mg of calcium and at least 800 mg of vitamin D per day.  Smoking and excessive alcohol intake increase the risk of osteoporosis. Eat foods that are rich in calcium and vitamin D, and do weight-bearing exercises several times each week as directed by your health care provider. What should I know about how menopause affects my mental health? Depression may occur at any age, but it is more common as you become older. Common symptoms of depression  include:  Low or sad mood.  Changes in sleep patterns.  Changes in appetite or eating patterns.  Feeling an overall lack of motivation or enjoyment of activities that you previously enjoyed.  Frequent crying spells.  Talk with your health care provider if you think that you are experiencing depression. What should I know about immunizations? It is important that you get and maintain your immunizations. These include:  Tetanus, diphtheria, and pertussis (Tdap) booster vaccine.  Influenza every year before the flu season begins.  Pneumonia vaccine.  Shingles vaccine.  Your health care provider may also recommend other immunizations. This information is not intended to replace advice given to you by your health care provider. Make sure you discuss any questions you have with your health care provider. Document Released: 04/23/2005  Document Revised: 09/19/2015 Document Reviewed: 12/03/2014 [employer] Interactive Patient Education  2018 [employer]  

## 2017-10-04 NOTE — Progress Notes (Signed)
Date:  10/04/2017   Name:  Alicia Andersen   DOB:  12-Jan-1956   MRN:  [id number]   Chief Complaint: Annual Exam (Breast exam. Last couple of months starting back spotting. ) Alicia Andersen is a 62 y.o. female who presents today for her Complete Annual Exam. She feels fairly well. She reports exercising none. She reports she is sleeping fairly well. She denies any breast issues.  She is overdue for mammogram. She is due for 10 yr colonoscopy.  Hypertension  This is a chronic problem. The problem is unchanged. The problem is controlled. Pertinent negatives include no chest pain, headaches, palpitations or shortness of breath. Past treatments include diuretics and beta blockers.  Migraine   This is a recurrent problem. The problem has been unchanged (about once a month). The pain quality is similar to prior headaches. Associated symptoms include dizziness. Pertinent negatives include no abdominal pain, coughing, fever, hearing loss, tinnitus or vomiting. She has tried triptans for the symptoms. Her past medical history is significant for hypertension.  Vaginal Bleeding  The patient's primary symptoms include vaginal bleeding. The patient's pertinent negatives include no vaginal discharge. The current episode started more than 1 month ago. The problem occurs intermittently. The patient is experiencing no pain. Pertinent negatives include no abdominal pain, chills, constipation, diarrhea, dysuria, fever, frequency, headaches, rash or vomiting. The vaginal discharge was scant and dark. She has not been passing clots. She has not been passing tissue. Nothing aggravates the symptoms.  RLS -  Still present but not too severe. Takes miripex but not every day.    Review of Systems  Constitutional: Negative for chills, fatigue and fever.  HENT: Negative for congestion, hearing loss, tinnitus, trouble swallowing and voice change.   Eyes: Negative for visual disturbance.  Respiratory: Negative for cough,  chest tightness, shortness of breath and wheezing.   Cardiovascular: Negative for chest pain, palpitations and leg swelling.  Gastrointestinal: Negative for abdominal pain, constipation, diarrhea and vomiting.  Endocrine: Negative for polydipsia and polyuria.  Genitourinary: Positive for menstrual problem (intermittent spotting several months ago). Negative for dysuria, frequency, genital sores, vaginal bleeding and vaginal discharge.  Musculoskeletal: Negative for arthralgias, gait problem and joint swelling.  Skin: Negative for color change and rash.  Neurological: Positive for dizziness. Negative for tremors, light-headedness and headaches.  Hematological: Negative for adenopathy. Does not bruise/bleed easily.  Psychiatric/Behavioral: Negative for dysphoric mood and sleep disturbance. The patient is not nervous/anxious.     Patient Active Problem List   Diagnosis Date Noted  . Vertigo 10/01/2016  . Ankle edema 04/11/2015  . Plantar fasciitis 12/31/2014  . Dyslipidemia 12/31/2014  . Allergic state 12/31/2014  . Essential (primary) hypertension 12/31/2014  . Arthralgia of hip 12/31/2014  . Blood glucose elevated 12/31/2014  . Migraine without aura and responsive to treatment 12/31/2014  . Arthritis of knee, degenerative 12/31/2014  . Periodic limb movement 12/31/2014  . Apnea, sleep 12/31/2014    Prior to Admission medications   Medication Sig Start Date End Date Taking? Authorizing Provider  acetaminophen (TYLENOL) 500 MG tablet Take by mouth.   Yes [provider]  amitriptyline (ELAVIL) 50 MG tablet Take 1 tablet by mouth at bedtime. 12/14/13  Yes [provider]  cyclobenzaprine (FLEXERIL) 10 MG tablet Take 1 tablet (10 mg total) by mouth at bedtime. 10/01/15  Yes Glean Hess, MD  fexofenadine [facility] ALLERGY) 180 MG tablet Take 1 tablet (180 mg total) by mouth daily. 01/08/15  Yes Glean Hess, MD  fluticasone (FLONASE) 50 MCG/ACT nasal spray Place  2 sprays into both nostrils daily. 01/08/15  Yes Glean Hess, MD  hydrochlorothiazide (HYDRODIURIL) 25 MG tablet Take 1 tablet (25 mg total) by mouth daily. 10/01/16  Yes Glean Hess, MD  ibuprofen (ADVIL,MOTRIN) 600 MG tablet Take 1 tablet (600 mg total) by mouth every 8 (eight) hours as needed. 10/01/16  Yes Glean Hess, MD  pramipexole (MIRAPEX) 0.25 MG tablet Take 1 tablet by mouth at bedtime. 12/14/13  Yes [provider]  propranolol ER (INDERAL LA) 80 MG 24 hr capsule Take 1 capsule (80 mg total) by mouth daily. 10/01/16  Yes Glean Hess, MD  SUMAtriptan (IMITREX) 100 MG tablet Take 1 tablet (100 mg total) by mouth daily as needed. 10/01/15  Yes Glean Hess, MD    No Known Allergies  Past Surgical History:  Procedure Laterality Date  . CATARACT EXTRACTION Left 2016  . ENDOMETRIAL BIOPSY  2005   benign  . KNEE ARTHROSCOPY Right 2009  . TEMPORAL ARTERY BIOPSY / LIGATION  05/2013   negative    Social History   Tobacco Use  . Smoking status: Never Smoker  . Smokeless tobacco: Never Used  Substance Use Topics  . Alcohol use: No    Alcohol/week: 0.0 oz  . Drug use: Not on file     Medication list has been reviewed and updated.  Current Meds  Medication Sig  . acetaminophen (TYLENOL) 500 MG tablet Take by mouth.  Marland Kitchen amitriptyline (ELAVIL) 50 MG tablet Take 1 tablet by mouth at bedtime.  . cyclobenzaprine (FLEXERIL) 10 MG tablet Take 1 tablet (10 mg total) by mouth at bedtime.  . fexofenadine (ALLEGRA ALLERGY) 180 MG tablet Take 1 tablet (180 mg total) by mouth daily.  . fluticasone (FLONASE) 50 MCG/ACT nasal spray Place 2 sprays into both nostrils daily.  . hydrochlorothiazide (HYDRODIURIL) 25 MG tablet Take 1 tablet (25 mg total) by mouth daily.  Marland Kitchen ibuprofen (ADVIL,MOTRIN) 600 MG tablet Take 1 tablet (600 mg total) by mouth every 8 (eight) hours as needed.  . pramipexole (MIRAPEX) 0.25 MG tablet Take 1 tablet by mouth at bedtime.  .  propranolol ER (INDERAL LA) 80 MG 24 hr capsule Take 1 capsule (80 mg total) by mouth daily.  . SUMAtriptan (IMITREX) 100 MG tablet Take 1 tablet (100 mg total) by mouth daily as needed.    PHQ 2/9 Scores 10/04/2017 10/01/2016 10/01/2015 04/11/2015  PHQ - 2 Score 1 0 0 0    Physical Exam  Constitutional: She is oriented to person, place, and time. She appears well-developed and well-nourished. No distress.  HENT:  Head: Normocephalic and atraumatic.  Right Ear: Tympanic membrane and ear canal normal.  Left Ear: Tympanic membrane and ear canal normal.  Nose: Right sinus exhibits no maxillary sinus tenderness. Left sinus exhibits no maxillary sinus tenderness.  Mouth/Throat: Uvula is midline and oropharynx is clear and moist.  Eyes: Conjunctivae and EOM are normal. Right eye exhibits no discharge. Left eye exhibits no discharge. No scleral icterus.  Neck: Normal range of motion. Carotid bruit is not present. No erythema present. No thyromegaly present.  Cardiovascular: Normal rate, regular rhythm, normal heart sounds and normal pulses.  Pulmonary/Chest: Effort normal. No respiratory distress. She has no wheezes. Right breast exhibits no mass, no nipple discharge, no skin change and no tenderness. Left breast exhibits no mass, no nipple discharge, no skin change and no tenderness.  Abdominal: Soft. Bowel sounds are normal. There is no  hepatosplenomegaly. There is no tenderness. There is no CVA tenderness.  Genitourinary: Vagina normal and uterus normal. There is no rash, tenderness or lesion on the right labia. There is no rash, tenderness or lesion on the left labia. Cervix exhibits discharge. Cervix exhibits no motion tenderness and no friability. Right adnexum displays no mass, no tenderness and no fullness. Left adnexum displays no mass, no tenderness and no fullness. No erythema, tenderness or bleeding in the vagina. No foreign body in the vagina. No signs of injury around the vagina. No vaginal  discharge found.  Musculoskeletal: Normal range of motion.  Lymphadenopathy:    She has no cervical adenopathy.    She has no axillary adenopathy.  Neurological: She is alert and oriented to person, place, and time. She has normal reflexes. No cranial nerve deficit or sensory deficit.  Skin: Skin is warm, dry and intact. No rash noted.  Psychiatric: She has a normal mood and affect. Her speech is normal and behavior is normal. Thought content normal.  Nursing note and vitals reviewed.   BP 118/62   Pulse 82   Ht 5\' 4"  (1.626 m)   Wt 214 lb (97.1 kg)   SpO2 98%   BMI 36.73 kg/m   Assessment and Plan: 1. Annual physical exam - Lipid panel  2. Bilateral low back pain without sciatica Continue flexeril as needed - cyclobenzaprine (FLEXERIL) 10 MG tablet; Take 1 tablet (10 mg total) by mouth at bedtime.  Dispense: 30 tablet; Refill: 5  3. Essential hypertension controlled - hydrochlorothiazide (HYDRODIURIL) 25 MG tablet; Take 1 tablet (25 mg total) by mouth daily.  Dispense: 90 tablet; Refill: 3 - CBC with Differential/Platelet - Comprehensive metabolic panel - TSH - POCT urinalysis dipstick  4. Bilateral low back pain without sciatica, unspecified chronicity - ibuprofen (ADVIL,MOTRIN) 600 MG tablet; Take 1 tablet (600 mg total) by mouth every 8 (eight) hours as needed.  Dispense: 90 tablet; Refill: 5  5. Migraine without aura and responsive to treatment Chronic, recurrent - SUMAtriptan (IMITREX) 100 MG tablet; Take 1 tablet (100 mg total) by mouth daily as needed.  Dispense: 12 tablet; Refill: 3 - propranolol ER (INDERAL LA) 80 MG 24 hr capsule; Take 1 capsule (80 mg total) by mouth daily.  Dispense: 30 capsule; Refill: 12  6. Post-menopausal bleeding - Ambulatory referral to Obstetrics / Gynecology - Cytology - PAP  7. Breast cancer screening Schedule at [facility] - MM DIGITAL SCREENING BILATERAL  8. Colon cancer screening - Ambulatory referral to Gastroenterology   Meds  ordered this encounter  Medications  . amitriptyline (ELAVIL) 50 MG tablet    Sig: Take 1 tablet (50 mg total) by mouth at bedtime.    Dispense:  30 tablet    Refill:  5  . cyclobenzaprine (FLEXERIL) 10 MG tablet    Sig: Take 1 tablet (10 mg total) by mouth at bedtime.    Dispense:  30 tablet    Refill:  5  . SUMAtriptan (IMITREX) 100 MG tablet    Sig: Take 1 tablet (100 mg total) by mouth daily as needed.    Dispense:  12 tablet    Refill:  3  . fexofenadine (ALLEGRA ALLERGY) 180 MG tablet    Sig: Take 1 tablet (180 mg total) by mouth daily.    Dispense:  30 tablet    Refill:  3  . hydrochlorothiazide (HYDRODIURIL) 25 MG tablet    Sig: Take 1 tablet (25 mg total) by mouth daily.    Dispense:  90 tablet    Refill:  3  . ibuprofen (ADVIL,MOTRIN) 600 MG tablet    Sig: Take 1 tablet (600 mg total) by mouth every 8 (eight) hours as needed.    Dispense:  90 tablet    Refill:  5  . propranolol ER (INDERAL LA) 80 MG 24 hr capsule    Sig: Take 1 capsule (80 mg total) by mouth daily.    Dispense:  30 capsule    Refill:  12    Partially dictated using Editor, commissioning. Any errors are unintentional.  Halina Maidens, MD [facility]  10/04/2017

## 2017-10-05 LAB — COMPREHENSIVE METABOLIC PANEL
A/G RATIO: 1.6 (ref 1.2–2.2)
ALK PHOS: 89 IU/L (ref 39–117)
ALT: 12 IU/L (ref 0–32)
AST: 11 IU/L (ref 0–40)
Albumin: 4.2 g/dL (ref 3.6–4.8)
BUN/Creatinine Ratio: 13 (ref 12–28)
BUN: 11 mg/dL (ref 8–27)
Bilirubin Total: 0.8 mg/dL (ref 0.0–1.2)
CALCIUM: 10.5 mg/dL — AB (ref 8.7–10.3)
CO2: 23 mmol/L (ref 20–29)
CREATININE: 0.87 mg/dL (ref 0.57–1.00)
Chloride: 107 mmol/L — ABNORMAL HIGH (ref 96–106)
GFR calc Af Amer: 84 mL/min/{1.73_m2} (ref >59)
GFR, EST NON AFRICAN AMERICAN: 73 mL/min/{1.73_m2} (ref >59)
GLOBULIN, TOTAL: 2.7 g/dL (ref 1.5–4.5)
Glucose: 111 mg/dL — ABNORMAL HIGH (ref 65–99)
POTASSIUM: 3.8 mmol/L (ref 3.5–5.2)
SODIUM: 147 mmol/L — AB (ref 134–144)
Total Protein: 6.9 g/dL (ref 6.0–8.5)

## 2017-10-05 LAB — CBC WITH DIFFERENTIAL/PLATELET
BASOS ABS: 0 10*3/uL (ref 0.0–0.2)
Basos: 0 %
EOS (ABSOLUTE): 0.2 10*3/uL (ref 0.0–0.4)
Eos: 3 %
Hematocrit: 40.5 % (ref 34.0–46.6)
Hemoglobin: 13.1 g/dL (ref 11.1–15.9)
IMMATURE GRANS (ABS): 0 10*3/uL (ref 0.0–0.1)
IMMATURE GRANULOCYTES: 0 %
LYMPHS: 28 %
Lymphocytes Absolute: 2.1 10*3/uL (ref 0.7–3.1)
MCH: 27.9 pg (ref 26.6–33.0)
MCHC: 32.3 g/dL (ref 31.5–35.7)
MCV: 86 fL (ref 79–97)
Monocytes Absolute: 0.6 10*3/uL (ref 0.1–0.9)
Monocytes: 8 %
NEUTROS ABS: 4.5 10*3/uL (ref 1.4–7.0)
NEUTROS PCT: 61 %
PLATELETS: 240 10*3/uL (ref 150–450)
RBC: 4.7 x10E6/uL (ref 3.77–5.28)
RDW: 15.1 % (ref 12.3–15.4)
WBC: 7.5 10*3/uL (ref 3.4–10.8)

## 2017-10-05 LAB — CYTOLOGY - PAP: DIAGNOSIS: NEGATIVE

## 2017-10-05 LAB — LIPID PANEL
CHOL/HDL RATIO: 3.5 ratio (ref 0.0–4.4)
CHOLESTEROL TOTAL: 269 mg/dL — AB (ref 100–199)
HDL: 76 mg/dL (ref >39)
LDL Calculated: 171 mg/dL — ABNORMAL HIGH (ref 0–99)
TRIGLYCERIDES: 111 mg/dL (ref 0–149)
VLDL Cholesterol Cal: 22 mg/dL (ref 5–40)

## 2017-10-05 LAB — TSH: TSH: 1.46 u[IU]/mL (ref 0.450–4.500)

## 2017-11-30 ENCOUNTER — Ambulatory Visit (INDEPENDENT_AMBULATORY_CARE_PROVIDER_SITE_OTHER): Payer: BLUE CROSS/BLUE SHIELD | Admitting: Internal Medicine

## 2017-11-30 ENCOUNTER — Ambulatory Visit
Admission: RE | Admit: 2017-11-30 | Discharge: 2017-11-30 | Disposition: A | Discharge status: Home / Self Care | Payer: BLUE CROSS/BLUE SHIELD | Source: Ambulatory Visit | Attending: Internal Medicine | Admitting: Internal Medicine

## 2017-11-30 ENCOUNTER — Encounter: Payer: Self-pay | Admitting: Internal Medicine

## 2017-11-30 VITALS — BP 112/68 | HR 75 | Ht 64.0 in | Wt 214.0 lb

## 2017-11-30 DIAGNOSIS — M25572 Pain in left ankle and joints of left foot: Secondary | ICD-10-CM

## 2017-11-30 MED ORDER — TRAMADOL HCL 50 MG PO TABS: 50.0000 mg | ORAL_TABLET | Freq: Three times a day (TID) | ORAL | 0 refills | Status: DC | PRN

## 2017-11-30 NOTE — Patient Instructions (Signed)
Take Advil only three times a day as needed

## 2017-11-30 NOTE — Progress Notes (Signed)
Date:  11/30/2017   Name:  Alicia Andersen   DOB:  Jul 21, 1955   MRN:  [id number]   Chief Complaint: Edema (Left Ankle- swollen. Started two weeks ago. Very painful. Not hot to touch. Hard to walk on it. No falls or injuries. )  Ankle Pain   There was no injury mechanism. The pain is present in the left ankle. The quality of the pain is described as aching. The pain is moderate. The pain has been fluctuating since onset. She reports no foreign bodies present. The symptoms are aggravated by movement, palpation and weight bearing. She has tried NSAIDs for the symptoms. The treatment provided mild relief.     Review of Systems  Constitutional: Negative for chills, fatigue and fever.  Respiratory: Negative for apnea, cough and shortness of breath.   Cardiovascular: Positive for leg swelling. Negative for chest pain and palpitations.  Musculoskeletal: Positive for arthralgias (left ankle).  Neurological: Positive for headaches. Negative for dizziness.    Patient Active Problem List   Diagnosis Date Noted  . Vertigo 10/01/2016  . Ankle edema 04/11/2015  . Plantar fasciitis 12/31/2014  . Dyslipidemia 12/31/2014  . Allergic state 12/31/2014  . Essential (primary) hypertension 12/31/2014  . Arthralgia of hip 12/31/2014  . Blood glucose elevated 12/31/2014  . Migraine without aura and responsive to treatment 12/31/2014  . Arthritis of knee, degenerative 12/31/2014  . Periodic limb movement 12/31/2014  . Apnea, sleep 12/31/2014    No Known Allergies  Past Surgical History:  Procedure Laterality Date  . CATARACT EXTRACTION Left 2016  . ENDOMETRIAL BIOPSY  2005   benign  . KNEE ARTHROSCOPY Right 2009  . TEMPORAL ARTERY BIOPSY / LIGATION  05/2013   negative    Social History   Tobacco Use  . Smoking status: Never Smoker  . Smokeless tobacco: Never Used  Substance Use Topics  . Alcohol use: No    Alcohol/week: 0.0 standard drinks  . Drug use: Not on file     Medication  list has been reviewed and updated.  Current Meds  Medication Sig  . acetaminophen (TYLENOL) 500 MG tablet Take by mouth.  Marland Kitchen amitriptyline (ELAVIL) 50 MG tablet Take 1 tablet (50 mg total) by mouth at bedtime.  . cyclobenzaprine (FLEXERIL) 10 MG tablet Take 1 tablet (10 mg total) by mouth at bedtime.  . fexofenadine (ALLEGRA ALLERGY) 180 MG tablet Take 1 tablet (180 mg total) by mouth daily.  . fluticasone (FLONASE) 50 MCG/ACT nasal spray Place 2 sprays into both nostrils daily.  . hydrochlorothiazide (HYDRODIURIL) 25 MG tablet Take 1 tablet (25 mg total) by mouth daily.  Marland Kitchen ibuprofen (ADVIL,MOTRIN) 600 MG tablet Take 1 tablet (600 mg total) by mouth every 8 (eight) hours as needed.  . pramipexole (MIRAPEX) 0.25 MG tablet Take 1 tablet by mouth at bedtime.  . propranolol ER (INDERAL LA) 80 MG 24 hr capsule Take 1 capsule (80 mg total) by mouth daily.  . SUMAtriptan (IMITREX) 100 MG tablet Take 1 tablet (100 mg total) by mouth daily as needed.    PHQ 2/9 Scores 10/04/2017 10/01/2016 10/01/2015 04/11/2015  PHQ - 2 Score 1 0 0 0    Physical Exam  Constitutional: She is oriented to person, place, and time. She appears well-developed. No distress.  HENT:  Head: Normocephalic and atraumatic.  Cardiovascular: Normal rate, regular rhythm and normal heart sounds.  Pulmonary/Chest: Effort normal and breath sounds normal. No respiratory distress.  Musculoskeletal:       Left ankle:  She exhibits decreased range of motion and swelling. Tenderness. Medial malleolus tenderness found. Achilles tendon exhibits no pain and no defect.  Neurological: She is alert and oriented to person, place, and time.  Skin: Skin is warm and dry. No rash noted.  Psychiatric: She has a normal mood and affect. Her behavior is normal. Thought content normal.  Nursing note and vitals reviewed.   BP 112/68 (BP Location: Right Arm, Patient Position: Sitting, Cuff Size: Normal)   Pulse 75   Ht 5\' 4"  (1.626 m)   Wt 214 lb  (97.1 kg)   SpO2 95%   BMI 36.73 kg/m   Assessment and Plan: 1. Acute left ankle pain Limit advil to three per day May take tramadol 25 mg tid prn severe pain May need to see ortho if evidence of OA - DG Ankle Complete Left; Future - traMADol (ULTRAM) 50 MG tablet; Take 1 tablet (50 mg total) by mouth 3 (three) times daily as needed.  Dispense: 21 tablet; Refill: 0   Meds ordered this encounter  Medications  . traMADol (ULTRAM) 50 MG tablet    Sig: Take 1 tablet (50 mg total) by mouth 3 (three) times daily as needed.    Dispense:  21 tablet    Refill:  0    Partially dictated using Editor, commissioning. Any errors are unintentional.  Halina Maidens, MD [facility]  11/30/2017

## 2018-03-31 ENCOUNTER — Encounter: Payer: Self-pay | Admitting: Internal Medicine

## 2018-03-31 ENCOUNTER — Ambulatory Visit: Payer: BLUE CROSS/BLUE SHIELD | Admitting: Internal Medicine

## 2018-03-31 VITALS — BP 124/78 | HR 65 | Temp 98.1°F | Ht 64.0 in | Wt 222.0 lb

## 2018-03-31 DIAGNOSIS — J019 Acute sinusitis, unspecified: Secondary | ICD-10-CM | POA: Diagnosis not present

## 2018-03-31 DIAGNOSIS — H1033 Unspecified acute conjunctivitis, bilateral: Secondary | ICD-10-CM

## 2018-03-31 MED ORDER — AMOXICILLIN-POT CLAVULANATE 875-125 MG PO TABS: 1.0000 | ORAL_TABLET | Freq: Two times a day (BID) | ORAL | 0 refills | Status: AC

## 2018-03-31 MED ORDER — NEOMYCIN-POLYMYXIN-DEXAMETH 3.5-10000-0.1 OP SUSP: 2.0000 [drp] | Freq: Four times a day (QID) | OPHTHALMIC | 0 refills | Status: DC

## 2018-03-31 NOTE — Progress Notes (Signed)
Date:  03/31/2018   Name:  Alicia Andersen   DOB:  1955/10/02   MRN:  [id number]   Chief Complaint: Sinusitis (X 3 weeks. Losing voice. Sinus pressure/ pain. Headache. No fever. )  Sinusitis  This is a new problem. The current episode started 1 to 4 weeks ago. The problem is unchanged. There has been no fever. Associated symptoms include congestion, headaches and sinus pressure. Pertinent negatives include no chills, coughing, shortness of breath or sore throat. Past treatments include spray decongestants (allegra). The treatment provided no relief.    Review of Systems  Constitutional: Negative for chills, fatigue and fever.  HENT: Positive for congestion and sinus pressure. Negative for sore throat, trouble swallowing and voice change.   Eyes: Positive for redness and visual disturbance.  Respiratory: Negative for cough, choking, shortness of breath and wheezing.   Cardiovascular: Negative for palpitations.  Gastrointestinal: Negative for diarrhea and nausea.  Neurological: Positive for headaches. Negative for dizziness.  Hematological: Negative for adenopathy.    Patient Active Problem List   Diagnosis Date Noted  . Vertigo 10/01/2016  . Ankle edema 04/11/2015  . Plantar fasciitis 12/31/2014  . Dyslipidemia 12/31/2014  . Allergic state 12/31/2014  . Essential (primary) hypertension 12/31/2014  . Arthralgia of hip 12/31/2014  . Blood glucose elevated 12/31/2014  . Migraine without aura and responsive to treatment 12/31/2014  . Arthritis of knee, degenerative 12/31/2014  . Periodic limb movement 12/31/2014  . Apnea, sleep 12/31/2014    No Known Allergies  Past Surgical History:  Procedure Laterality Date  . CATARACT EXTRACTION Left 2016  . ENDOMETRIAL BIOPSY  2005   benign  . KNEE ARTHROSCOPY Right 2009  . TEMPORAL ARTERY BIOPSY / LIGATION  05/2013   negative    Social History   Tobacco Use  . Smoking status: Never Smoker  . Smokeless tobacco: Never Used    Substance Use Topics  . Alcohol use: No    Alcohol/week: 0.0 standard drinks  . Drug use: Not on file     Medication list has been reviewed and updated.  Current Meds  Medication Sig  . acetaminophen (TYLENOL) 500 MG tablet Take by mouth.  Marland Kitchen amitriptyline (ELAVIL) 50 MG tablet Take 1 tablet (50 mg total) by mouth at bedtime.  . cyclobenzaprine (FLEXERIL) 10 MG tablet Take 1 tablet (10 mg total) by mouth at bedtime.  . fexofenadine (ALLEGRA ALLERGY) 180 MG tablet Take 1 tablet (180 mg total) by mouth daily.  . fluticasone (FLONASE) 50 MCG/ACT nasal spray Place 2 sprays into both nostrils daily.  . hydrochlorothiazide (HYDRODIURIL) 25 MG tablet Take 1 tablet (25 mg total) by mouth daily.  Marland Kitchen ibuprofen (ADVIL,MOTRIN) 600 MG tablet Take 1 tablet (600 mg total) by mouth every 8 (eight) hours as needed.  . pramipexole (MIRAPEX) 0.25 MG tablet Take 1 tablet by mouth at bedtime.  . propranolol ER (INDERAL LA) 80 MG 24 hr capsule Take 1 capsule (80 mg total) by mouth daily.  . SUMAtriptan (IMITREX) 100 MG tablet Take 1 tablet (100 mg total) by mouth daily as needed.  . traMADol (ULTRAM) 50 MG tablet Take 1 tablet (50 mg total) by mouth 3 (three) times daily as needed.    PHQ 2/9 Scores 10/04/2017 10/01/2016 10/01/2015 04/11/2015  PHQ - 2 Score 1 0 0 0    Physical Exam Constitutional:      Appearance: She is well-developed.  HENT:     Right Ear: Ear canal and external ear normal. Tympanic membrane is  retracted. Tympanic membrane is not erythematous.     Left Ear: Ear canal and external ear normal. Tympanic membrane is retracted. Tympanic membrane is not erythematous.     Nose:     Right Sinus: Maxillary sinus tenderness present. No frontal sinus tenderness.     Left Sinus: Maxillary sinus tenderness present. No frontal sinus tenderness.     Mouth/Throat:     Mouth: No oral lesions.     Pharynx: Uvula midline. Posterior oropharyngeal erythema present. No oropharyngeal exudate.  Eyes:      Conjunctiva/sclera:     Right eye: Chemosis present.     Left eye: Chemosis present.  Cardiovascular:     Rate and Rhythm: Normal rate and regular rhythm.     Heart sounds: Normal heart sounds.  Pulmonary:     Breath sounds: Normal breath sounds. No wheezing or rales.  Lymphadenopathy:     Cervical: No cervical adenopathy.  Neurological:     Mental Status: She is alert and oriented to person, place, and time.     BP 124/78   Pulse 65   Temp 98.1 F (36.7 C) (Oral)   Ht 5\' 4"  (1.626 m)   Wt 222 lb (100.7 kg)   SpO2 96%   BMI 38.11 kg/m   Assessment and Plan: 1. Acute non-recurrent sinusitis, unspecified location Continue flonase and allegra - amoxicillin-clavulanate (AUGMENTIN) 875-125 MG tablet; Take 1 tablet by mouth 2 (two) times daily for 10 days.  Dispense: 20 tablet; Refill: 0  2. Acute bacterial conjunctivitis of both eyes - neomycin-polymyxin b-dexamethasone (MAXITROL) 3.5-10000-0.1 SUSP; Place 2 drops into both eyes every 6 (six) hours.  Dispense: 5 mL; Refill: 0   Partially dictated using Editor, commissioning. Any errors are unintentional.  Halina Maidens, MD [facility]  03/31/2018

## 2018-04-19 ENCOUNTER — Other Ambulatory Visit: Payer: Self-pay | Admitting: Internal Medicine

## 2018-04-19 DIAGNOSIS — H1033 Unspecified acute conjunctivitis, bilateral: Secondary | ICD-10-CM

## 2018-04-19 DIAGNOSIS — M170 Bilateral primary osteoarthritis of knee: Secondary | ICD-10-CM

## 2018-04-19 DIAGNOSIS — J019 Acute sinusitis, unspecified: Secondary | ICD-10-CM

## 2018-04-19 MED ORDER — FLUTICASONE PROPIONATE 50 MCG/ACT NA SUSP: 2.0000 | Freq: Every day | NASAL | 3 refills | Status: DC

## 2018-04-19 MED ORDER — NEOMYCIN-POLYMYXIN-HC 3.5-10000-1 OP SUSP: 3.0000 [drp] | Freq: Three times a day (TID) | OPHTHALMIC | 0 refills | Status: DC

## 2018-05-26 ENCOUNTER — Other Ambulatory Visit: Payer: Self-pay

## 2018-05-26 ENCOUNTER — Ambulatory Visit (INDEPENDENT_AMBULATORY_CARE_PROVIDER_SITE_OTHER): Payer: BLUE CROSS/BLUE SHIELD | Admitting: Internal Medicine

## 2018-05-26 ENCOUNTER — Encounter: Payer: Self-pay | Admitting: Internal Medicine

## 2018-05-26 VITALS — BP 124/70 | HR 76 | Temp 98.1°F | Ht 64.0 in | Wt 217.0 lb

## 2018-05-26 DIAGNOSIS — J0100 Acute maxillary sinusitis, unspecified: Secondary | ICD-10-CM | POA: Diagnosis not present

## 2018-05-26 DIAGNOSIS — R059 Cough, unspecified: Secondary | ICD-10-CM

## 2018-05-26 DIAGNOSIS — R05 Cough: Secondary | ICD-10-CM

## 2018-05-26 MED ORDER — GUAIFENESIN-CODEINE 100-10 MG/5ML PO SYRP: 5.0000 mL | ORAL_SOLUTION | Freq: Three times a day (TID) | ORAL | 0 refills | Status: DC | PRN

## 2018-05-26 MED ORDER — DOXYCYCLINE HYCLATE 100 MG PO TABS: 100.0000 mg | ORAL_TABLET | Freq: Two times a day (BID) | ORAL | 0 refills | Status: AC

## 2018-05-26 NOTE — Patient Instructions (Signed)
Coricidin HBP - get at pharmacy and take as directed

## 2018-05-26 NOTE — Progress Notes (Signed)
Date:  05/26/2018   Name:  Alicia Andersen   DOB:  [birth year]   MRN:  [id number]   Chief Complaint: Cough (Cough, congestion. Slight fever or travel. Sick X 2 weeks. No production. )  Cough  This is a new problem. The current episode started in the past 7 days. The problem occurs every few minutes. The cough is non-productive. Associated symptoms include a sore throat and wheezing. Pertinent negatives include no ear congestion, fever, headaches, shortness of breath or weight loss.    Review of Systems  Constitutional: Negative for fever and weight loss.  HENT: Positive for sore throat.   Respiratory: Positive for cough and wheezing. Negative for shortness of breath.   Neurological: Negative for headaches.    Patient Active Problem List   Diagnosis Date Noted  . Vertigo 10/01/2016  . Ankle edema 04/11/2015  . Plantar fasciitis 12/31/2014  . Dyslipidemia 12/31/2014  . Allergic state 12/31/2014  . Essential (primary) hypertension 12/31/2014  . Arthralgia of hip 12/31/2014  . Blood glucose elevated 12/31/2014  . Migraine without aura and responsive to treatment 12/31/2014  . Arthritis of knee, degenerative 12/31/2014  . Periodic limb movement 12/31/2014  . Apnea, sleep 12/31/2014    No Known Allergies  Past Surgical History:  Procedure Laterality Date  . CATARACT EXTRACTION Left 2016  . ENDOMETRIAL BIOPSY  2005   benign  . KNEE ARTHROSCOPY Right 2009  . TEMPORAL ARTERY BIOPSY / LIGATION  05/2013   negative    Social History   Tobacco Use  . Smoking status: Never Smoker  . Smokeless tobacco: Never Used  Substance Use Topics  . Alcohol use: No    Alcohol/week: 0.0 standard drinks  . Drug use: Not on file     Medication list has been reviewed and updated.  Current Meds  Medication Sig  . acetaminophen (TYLENOL) 500 MG tablet Take by mouth.  Marland Kitchen amitriptyline (ELAVIL) 50 MG tablet Take 1 tablet (50 mg total) by mouth at bedtime.  . cyclobenzaprine (FLEXERIL) 10  MG tablet Take 1 tablet (10 mg total) by mouth at bedtime.  . fexofenadine (ALLEGRA ALLERGY) 180 MG tablet Take 1 tablet (180 mg total) by mouth daily.  . fluticasone (FLONASE) 50 MCG/ACT nasal spray Place 2 sprays into both nostrils daily.  . hydrochlorothiazide (HYDRODIURIL) 25 MG tablet Take 1 tablet (25 mg total) by mouth daily.  Marland Kitchen ibuprofen (ADVIL,MOTRIN) 600 MG tablet Take 1 tablet (600 mg total) by mouth every 8 (eight) hours as needed.  . neomycin-polymyxin-hydrocortisone (CORTISPORIN) 3.5-10000-1 ophthalmic suspension Place 3 drops into both eyes 3 (three) times daily.  . pramipexole (MIRAPEX) 0.25 MG tablet Take 1 tablet by mouth at bedtime.  . propranolol ER (INDERAL LA) 80 MG 24 hr capsule Take 1 capsule (80 mg total) by mouth daily.  . SUMAtriptan (IMITREX) 100 MG tablet Take 1 tablet (100 mg total) by mouth daily as needed.  . traMADol (ULTRAM) 50 MG tablet Take 1 tablet (50 mg total) by mouth 3 (three) times daily as needed.    PHQ 2/9 Scores 05/26/2018 10/04/2017 10/01/2016 10/01/2015  PHQ - 2 Score 0 1 0 0    Physical Exam Constitutional:      Appearance: She is well-developed.  HENT:     Right Ear: Ear canal and external ear normal. Tympanic membrane is not erythematous or retracted.     Left Ear: Ear canal and external ear normal. Tympanic membrane is not erythematous or retracted.     Nose:  Right Sinus: Maxillary sinus tenderness and frontal sinus tenderness present.     Left Sinus: Maxillary sinus tenderness and frontal sinus tenderness present.     Mouth/Throat:     Mouth: No oral lesions.     Pharynx: Uvula midline. Posterior oropharyngeal erythema present. No oropharyngeal exudate.  Neck:     Musculoskeletal: Normal range of motion and neck supple.  Cardiovascular:     Rate and Rhythm: Normal rate and regular rhythm.     Heart sounds: Normal heart sounds.  Pulmonary:     Breath sounds: Normal breath sounds. No wheezing or rales.  Lymphadenopathy:      Cervical: No cervical adenopathy.  Neurological:     Mental Status: She is alert and oriented to person, place, and time.     Wt Readings from Last 3 Encounters:  05/26/18 217 lb (98.4 kg)  03/31/18 222 lb (100.7 kg)  11/30/17 214 lb (97.1 kg)    BP 124/70   Pulse 76   Temp 98.1 F (36.7 C) (Oral)   Ht 5\' 4"  (1.626 m)   Wt 217 lb (98.4 kg)   SpO2 97%   BMI 37.25 kg/m   Assessment and Plan: 1. Acute non-recurrent maxillary sinusitis Continue Nyquil as needed Coricidin HBP - doxycycline (VIBRA-TABS) 100 MG tablet; Take 1 tablet (100 mg total) by mouth 2 (two) times daily for 10 days.  Dispense: 20 tablet; Refill: 0  2. Cough Use robitussin AC at night - guaiFENesin-codeine (ROBITUSSIN AC) 100-10 MG/5ML syrup; Take 5 mLs by mouth 3 (three) times daily as needed for cough.  Dispense: 118 mL; Refill: 0   Partially dictated using Editor, commissioning. Any errors are unintentional.  Halina Maidens, MD [facility]  05/26/2018

## 2018-10-06 ENCOUNTER — Encounter: Payer: BLUE CROSS/BLUE SHIELD | Admitting: Internal Medicine

## 2018-11-21 ENCOUNTER — Encounter: Payer: Self-pay | Admitting: Internal Medicine

## 2018-11-21 ENCOUNTER — Other Ambulatory Visit: Payer: Self-pay

## 2018-11-21 ENCOUNTER — Ambulatory Visit (INDEPENDENT_AMBULATORY_CARE_PROVIDER_SITE_OTHER): Payer: BC Managed Care – PPO | Admitting: Internal Medicine

## 2018-11-21 VITALS — Temp 98.0°F | Ht 64.0 in | Wt 217.0 lb

## 2018-11-21 DIAGNOSIS — F0630 Mood disorder due to known physiological condition, unspecified: Secondary | ICD-10-CM | POA: Diagnosis not present

## 2018-11-21 DIAGNOSIS — J019 Acute sinusitis, unspecified: Secondary | ICD-10-CM | POA: Diagnosis not present

## 2018-11-21 DIAGNOSIS — M25473 Effusion, unspecified ankle: Secondary | ICD-10-CM | POA: Diagnosis not present

## 2018-11-21 MED ORDER — FLUTICASONE PROPIONATE 50 MCG/ACT NA SUSP: 2.0000 | Freq: Every day | NASAL | 3 refills | Status: DC

## 2018-11-21 MED ORDER — CLINDAMYCIN HCL 300 MG PO CAPS: 300.0000 mg | ORAL_CAPSULE | Freq: Three times a day (TID) | ORAL | 0 refills | Status: AC

## 2018-11-21 NOTE — Progress Notes (Signed)
Date:  11/21/2018   Name:  Alicia Andersen   DOB:  1955-04-30   MRN:  [id number]  I connected with this patient, Alicia Andersen, by telephone in the patients car.  I verified that I am speaking with the correct person using two identifiers. This visit was conducted via telephone due to the Covid-19 outbreak from my office at [facility] in [place], [place]. I discussed the limitations, risks, security and privacy concerns of performing an evaluation and management service by telephone. I also discussed with the patient that there may be a patient responsible charge related to this service. The patient expressed understanding and agreed to proceed.  Chief Complaint: Sore Throat (Sore throat and congestion. Started 4 weeks. Sinus pressure. Cough- little mucous ( yellow ) . Ear pain - right is worse than left. Feeling off balance.)  Sore Throat  This is a chronic problem. The current episode started 1 to 4 weeks ago. The problem has been unchanged. There has been no fever. Associated symptoms include congestion, coughing, ear pain and a hoarse voice. Pertinent negatives include no headaches or shortness of breath.  Sinusitis This is a recurrent problem. There has been no fever. The pain is mild. Associated symptoms include congestion, coughing, ear pain, a hoarse voice, sinus pressure and a sore throat. Pertinent negatives include no chills, headaches or shortness of breath. Past treatments include spray decongestants (antihistamines and flonase). The treatment provided no relief.  Edema - she continues to have ankle edema which is not changing despite HCTZ.  She is not really limiting her sodium intake.  The edema does go down overnight most of the time.  She was seen by Podiatry for plantar fasciitis and was given a cortisone injection.  The swelling has not changed.  Review of Systems  Constitutional: Negative for chills, fatigue, fever and unexpected weight change.  HENT: Positive for  congestion, ear pain, hoarse voice, sinus pressure and sore throat.   Respiratory: Positive for cough and wheezing. Negative for chest tightness and shortness of breath.   Cardiovascular: Positive for leg swelling. Negative for chest pain and palpitations.  Neurological: Positive for dizziness and light-headedness. Negative for headaches.    Patient Active Problem List   Diagnosis Date Noted  . Vertigo 10/01/2016  . Ankle edema 04/11/2015  . Plantar fasciitis 12/31/2014  . Dyslipidemia 12/31/2014  . Allergic state 12/31/2014  . Essential (primary) hypertension 12/31/2014  . Arthralgia of hip 12/31/2014  . Blood glucose elevated 12/31/2014  . Migraine without aura and responsive to treatment 12/31/2014  . Arthritis of knee, degenerative 12/31/2014  . Periodic limb movement 12/31/2014  . Apnea, sleep 12/31/2014    No Known Allergies  Past Surgical History:  Procedure Laterality Date  . CATARACT EXTRACTION Left 2016  . ENDOMETRIAL BIOPSY  2005   benign  . KNEE ARTHROSCOPY Right 2009  . TEMPORAL ARTERY BIOPSY / LIGATION  05/2013   negative    Social History   Tobacco Use  . Smoking status: Never Smoker  . Smokeless tobacco: Never Used  Substance Use Topics  . Alcohol use: No    Alcohol/week: 0.0 standard drinks  . Drug use: Not on file     Medication list has been reviewed and updated.  Current Meds  Medication Sig  . acetaminophen (TYLENOL) 500 MG tablet Take by mouth.  Marland Kitchen amitriptyline (ELAVIL) 50 MG tablet Take 1 tablet (50 mg total) by mouth at bedtime.  . cyclobenzaprine (FLEXERIL) 10 MG tablet Take 1 tablet (  10 mg total) by mouth at bedtime.  . fexofenadine (ALLEGRA ALLERGY) 180 MG tablet Take 1 tablet (180 mg total) by mouth daily.  . fluticasone (FLONASE) 50 MCG/ACT nasal spray Place 2 sprays into both nostrils daily.  . hydrochlorothiazide (HYDRODIURIL) 25 MG tablet Take 1 tablet (25 mg total) by mouth daily.  Marland Kitchen ibuprofen (ADVIL,MOTRIN) 600 MG tablet Take 1  tablet (600 mg total) by mouth every 8 (eight) hours as needed.  . pramipexole (MIRAPEX) 0.25 MG tablet Take 1 tablet by mouth at bedtime.  . propranolol ER (INDERAL LA) 80 MG 24 hr capsule Take 1 capsule (80 mg total) by mouth daily.  . SUMAtriptan (IMITREX) 100 MG tablet Take 1 tablet (100 mg total) by mouth daily as needed.  . traMADol (ULTRAM) 50 MG tablet Take 1 tablet (50 mg total) by mouth 3 (three) times daily as needed.  . [DISCONTINUED] fluticasone (FLONASE) 50 MCG/ACT nasal spray Place 2 sprays into both nostrils daily.    PHQ 2/9 Scores 11/21/2018 05/26/2018 10/04/2017 10/01/2016  PHQ - 2 Score 1 0 1 0  PHQ- 9 Score 10 - - -    BP Readings from Last 3 Encounters:  05/26/18 124/70  03/31/18 124/78  11/30/17 112/68    Physical Exam Pulmonary:     Comments: No cough or dyspnea noted during the call Neurological:     Mental Status: She is alert.  Psychiatric:        Attention and Perception: Attention normal.        Mood and Affect: Mood normal.        Speech: Speech normal.        Thought Content: Thought content does not include suicidal ideation. Thought content does not include suicidal plan.        Cognition and Memory: Cognition normal.        Judgment: Judgment normal.     Wt Readings from Last 3 Encounters:  11/21/18 217 lb (98.4 kg)  05/26/18 217 lb (98.4 kg)  03/31/18 222 lb (100.7 kg)    Temp 98 F (36.7 C) (Oral)   Ht 5\' 4"  (1.626 m)   Wt 217 lb (98.4 kg)   BMI 37.25 kg/m   Assessment and Plan: 1. Acute sinusitis, recurrence not specified, unspecified location Resume Flonase, increase fluids - clindamycin (CLEOCIN) 300 MG capsule; Take 1 capsule (300 mg total) by mouth 3 (three) times daily for 10 days.  Dispense: 30 capsule; Refill: 0 - fluticasone (FLONASE) 50 MCG/ACT nasal spray; Place 2 sprays into both nostrils daily.  Dispense: 16 g; Refill: 3  2. Ankle edema Continue HCTZ Encouraged pt to strictly limit sodium intake and drink more water  Elevate when able Additional diuretics will not be beneficial  3. 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 Partially dictated using Editor, commissioning. Any errors are unintentional.  Halina Maidens, MD [facility]  11/21/2018

## 2018-12-22 ENCOUNTER — Ambulatory Visit (INDEPENDENT_AMBULATORY_CARE_PROVIDER_SITE_OTHER): Payer: BC Managed Care – PPO | Admitting: Internal Medicine

## 2018-12-22 ENCOUNTER — Other Ambulatory Visit: Payer: Self-pay

## 2018-12-22 ENCOUNTER — Encounter: Payer: Self-pay | Admitting: Internal Medicine

## 2018-12-22 VITALS — BP 126/72 | HR 70 | Ht 64.0 in | Wt 211.0 lb

## 2018-12-22 DIAGNOSIS — G43009 Migraine without aura, not intractable, without status migrainosus: Secondary | ICD-10-CM

## 2018-12-22 DIAGNOSIS — Z0000 Encounter for general adult medical examination without abnormal findings: Secondary | ICD-10-CM

## 2018-12-22 DIAGNOSIS — R7303 Prediabetes: Secondary | ICD-10-CM

## 2018-12-22 DIAGNOSIS — I10 Essential (primary) hypertension: Secondary | ICD-10-CM | POA: Diagnosis not present

## 2018-12-22 DIAGNOSIS — Z1211 Encounter for screening for malignant neoplasm of colon: Secondary | ICD-10-CM | POA: Diagnosis not present

## 2018-12-22 DIAGNOSIS — Z23 Encounter for immunization: Secondary | ICD-10-CM | POA: Diagnosis not present

## 2018-12-22 DIAGNOSIS — Z1231 Encounter for screening mammogram for malignant neoplasm of breast: Secondary | ICD-10-CM

## 2018-12-22 LAB — POCT URINALYSIS DIPSTICK
Bilirubin, UA: NEGATIVE
Blood, UA: NEGATIVE
Glucose, UA: NEGATIVE
Ketones, UA: NEGATIVE
Leukocytes, UA: NEGATIVE
Nitrite, UA: NEGATIVE
Protein, UA: NEGATIVE
Spec Grav, UA: 1.015 (ref 1.010–1.025)
Urobilinogen, UA: 0.2 E.U./dL
pH, UA: 6 (ref 5.0–8.0)

## 2018-12-22 NOTE — Progress Notes (Signed)
Date:  12/22/2018   Name:  Alicia Andersen   DOB:  17-Feb-1956   MRN:  [id number]   Chief Complaint: Annual Exam (Breast Exam. Flu shot.) Jagger Woten is a 63 y.o. female who presents today for her Complete Annual Exam. She feels well. She reports exercising walking at work. She reports she is sleeping fairly well. He main complaint is foot pain and ankle OA followed by Podiatry.  Mammogram 02/2018  [facility] Colonoscopy - Pap 09/2017  Hypertension This is a chronic problem. The problem is controlled (BP at home 120/74). Associated symptoms include peripheral edema. Pertinent negatives include no chest pain, headaches, palpitations or shortness of breath. Past treatments include beta blockers and diuretics.  Diabetes She presents for her follow-up diabetic visit. Diabetes type: prediabetes. Pertinent negatives for hypoglycemia include no dizziness, headaches, nervousness/anxiousness or tremors. Pertinent negatives for diabetes include no chest pain, no fatigue, no polydipsia and no polyuria.  Migraine  This is a recurrent problem. The problem occurs intermittently (several per month). The pain does not radiate. The pain quality is similar to prior headaches. Associated symptoms include sinus pressure. Pertinent negatives include no abdominal pain, coughing, dizziness, fever, hearing loss, tinnitus or vomiting. She has tried triptans for the symptoms. The treatment provided significant relief. Her past medical history is significant for hypertension.  OSA - supposed to be using a CPAP but unable to tolerate it due to sleeping on her stomach.  Lab Results  Component Value Date   HGBA1C 6.2 (H) 10/01/2016   Lab Results  Component Value Date   CHOL 269 (H) 10/04/2017   HDL 76 10/04/2017   LDLCALC 171 (H) 10/04/2017   TRIG 111 10/04/2017   CHOLHDL 3.5 10/04/2017   Lab Results  Component Value Date   CREATININE 0.87 10/04/2017   BUN 11 10/04/2017   NA 147 (H) 10/04/2017   K 3.8 10/04/2017    CL 107 (H) 10/04/2017   CO2 23 10/04/2017     Review of Systems  Constitutional: Negative for chills, fatigue and fever.  HENT: Positive for sinus pressure. Negative for congestion, ear discharge, hearing loss, tinnitus, trouble swallowing and voice change.   Eyes: Negative for visual disturbance.  Respiratory: Negative for cough, chest tightness, shortness of breath and wheezing.   Cardiovascular: Negative for chest pain, palpitations and leg swelling.  Gastrointestinal: Negative for abdominal pain, constipation, diarrhea and vomiting.  Endocrine: Negative for polydipsia and polyuria.  Genitourinary: Negative for dysuria, frequency, genital sores, vaginal bleeding and vaginal discharge.  Musculoskeletal: Positive for arthralgias. Negative for gait problem and joint swelling.  Skin: Negative for color change and rash.  Allergic/Immunologic: Positive for environmental allergies.  Neurological: Negative for dizziness, tremors, light-headedness and headaches.  Hematological: Negative for adenopathy. Does not bruise/bleed easily.  Psychiatric/Behavioral: Negative for dysphoric mood and sleep disturbance. The patient is not nervous/anxious.     Patient Active Problem List   Diagnosis Date Noted  . Vertigo 10/01/2016  . Ankle edema 04/11/2015  . Plantar fasciitis 12/31/2014  . Dyslipidemia 12/31/2014  . Allergic state 12/31/2014  . Essential (primary) hypertension 12/31/2014  . Arthralgia of hip 12/31/2014  . Prediabetes 12/31/2014  . Migraine without aura and responsive to treatment 12/31/2014  . Arthritis of knee, degenerative 12/31/2014  . Periodic limb movement 12/31/2014  . Apnea, sleep 12/31/2014    No Known Allergies  Past Surgical History:  Procedure Laterality Date  . CATARACT EXTRACTION Left 2016  . ENDOMETRIAL BIOPSY  2005   benign  . KNEE  ARTHROSCOPY Right 2009  . TEMPORAL ARTERY BIOPSY / LIGATION  05/2013   negative    Social History   Tobacco Use  .  Smoking status: Never Smoker  . Smokeless tobacco: Never Used  Substance Use Topics  . Alcohol use: No    Alcohol/week: 0.0 standard drinks  . Drug use: Not on file     Medication list has been reviewed and updated.  Current Meds  Medication Sig  . acetaminophen (TYLENOL) 500 MG tablet Take by mouth.  Marland Kitchen amitriptyline (ELAVIL) 50 MG tablet Take 1 tablet (50 mg total) by mouth at bedtime.  . cyclobenzaprine (FLEXERIL) 10 MG tablet Take 1 tablet (10 mg total) by mouth at bedtime.  . fexofenadine (ALLEGRA ALLERGY) 180 MG tablet Take 1 tablet (180 mg total) by mouth daily.  . fluticasone (FLONASE) 50 MCG/ACT nasal spray Place 2 sprays into both nostrils daily.  . hydrochlorothiazide (HYDRODIURIL) 25 MG tablet Take 1 tablet (25 mg total) by mouth daily.  Marland Kitchen ibuprofen (ADVIL,MOTRIN) 600 MG tablet Take 1 tablet (600 mg total) by mouth every 8 (eight) hours as needed.  . pramipexole (MIRAPEX) 0.25 MG tablet Take 1 tablet by mouth at bedtime.  . propranolol ER (INDERAL LA) 80 MG 24 hr capsule Take 1 capsule (80 mg total) by mouth daily.  . SUMAtriptan (IMITREX) 100 MG tablet Take 1 tablet (100 mg total) by mouth daily as needed.    PHQ 2/9 Scores 12/22/2018 11/21/2018 05/26/2018 10/04/2017  PHQ - 2 Score 2 1 0 1  PHQ- 9 Score 9 10 - -    BP Readings from Last 3 Encounters:  12/22/18 126/72  05/26/18 124/70  03/31/18 124/78    Physical Exam Vitals signs and nursing note reviewed.  Constitutional:      General: She is not in acute distress.    Appearance: She is well-developed.  HENT:     Head: Normocephalic and atraumatic.     Right Ear: Ear canal normal. No middle ear effusion. Tympanic membrane is retracted. Tympanic membrane is not erythematous.     Left Ear: Ear canal normal.  No middle ear effusion. Tympanic membrane is retracted. Tympanic membrane is not erythematous.     Nose:     Right Sinus: No maxillary sinus tenderness.     Left Sinus: No maxillary sinus tenderness.  Eyes:      General: No scleral icterus.       Right eye: No discharge.        Left eye: No discharge.     Conjunctiva/sclera: Conjunctivae normal.  Neck:     Musculoskeletal: Normal range of motion. No erythema.     Thyroid: No thyromegaly.     Vascular: No carotid bruit.  Cardiovascular:     Rate and Rhythm: Normal rate and regular rhythm.     Pulses: Normal pulses.     Heart sounds: Normal heart sounds.  Pulmonary:     Effort: Pulmonary effort is normal. No respiratory distress.     Breath sounds: No wheezing.  Chest:     Breasts:        Right: No mass, nipple discharge, skin change or tenderness.        Left: No mass, nipple discharge, skin change or tenderness.  Abdominal:     General: Bowel sounds are normal.     Palpations: Abdomen is soft.     Tenderness: There is no abdominal tenderness.  Musculoskeletal: Normal range of motion.     Right lower leg: No  edema.     Left lower leg: No edema.  Lymphadenopathy:     Cervical: No cervical adenopathy.  Skin:    General: Skin is warm and dry.     Capillary Refill: Capillary refill takes less than 2 seconds.     Findings: No rash.  Neurological:     Mental Status: She is alert and oriented to person, place, and time.     Cranial Nerves: No cranial nerve deficit.     Sensory: No sensory deficit.     Deep Tendon Reflexes: Reflexes are normal and symmetric.  Psychiatric:        Attention and Perception: Attention normal.        Mood and Affect: Mood normal.        Speech: Speech normal.        Behavior: Behavior normal.        Thought Content: Thought content normal.     Wt Readings from Last 3 Encounters:  12/22/18 211 lb (95.7 kg)  11/21/18 217 lb (98.4 kg)  05/26/18 217 lb (98.4 kg)    BP 126/72   Pulse 70   Ht 5\' 4"  (1.626 m)   Wt 211 lb (95.7 kg)   SpO2 100%   BMI 36.22 kg/m   Assessment and Plan: 1. Annual physical exam Normal exam except for weight which is down a few pounds Continue to work on healthy diet;  pt unwilling to consider regular exercise - Lipid panel - POCT urinalysis dipstick  2. Encounter for screening mammogram for breast cancer To be scheduled at [facility] - MM [facility]D [place]; Future  3. Colon cancer screening Due for 10 yr follow up - Ambulatory referral to Gastroenterology  4. Essential (primary) hypertension Clinically stable exam with well controlled BP.   Tolerating medications, hctz 25 mg and inderal 80 mg per day, without side effects at this time. Pt to continue current regimen and low sodium diet; benefits of regular exercise as able discussed. - CBC with Differential/Platelet - Comprehensive metabolic panel - TSH  5. Migraine without aura and responsive to treatment Migraines are intermittent and respond well to Imitrex without side effects Continue Elavil for prevention  6. Prediabetes Will check labs and advise Continue low carb diet, and efforts at weight loss - Hemoglobin A1c  7. Need for immunization against influenza - Flu Vaccine QUAD 36+ mos IM   Partially dictated using Editor, commissioning. Any errors are unintentional.  Halina Maidens, MD [facility]  12/22/2018

## 2018-12-23 LAB — CBC WITH DIFFERENTIAL/PLATELET
Basophils Absolute: 0.1 10*3/uL (ref 0.0–0.2)
Basos: 1 %
EOS (ABSOLUTE): 0.2 10*3/uL (ref 0.0–0.4)
Eos: 2 %
Hematocrit: 41.8 % (ref 34.0–46.6)
Hemoglobin: 13.4 g/dL (ref 11.1–15.9)
Immature Grans (Abs): 0 10*3/uL (ref 0.0–0.1)
Immature Granulocytes: 0 %
Lymphocytes Absolute: 2.5 10*3/uL (ref 0.7–3.1)
Lymphs: 33 %
MCH: 27.7 pg (ref 26.6–33.0)
MCHC: 32.1 g/dL (ref 31.5–35.7)
MCV: 87 fL (ref 79–97)
Monocytes Absolute: 0.7 10*3/uL (ref 0.1–0.9)
Monocytes: 9 %
Neutrophils Absolute: 4.1 10*3/uL (ref 1.4–7.0)
Neutrophils: 55 %
Platelets: 247 10*3/uL (ref 150–450)
RBC: 4.83 x10E6/uL (ref 3.77–5.28)
RDW: 13 % (ref 11.7–15.4)
WBC: 7.4 10*3/uL (ref 3.4–10.8)

## 2018-12-23 LAB — COMPREHENSIVE METABOLIC PANEL
ALT: 7 IU/L (ref 0–32)
AST: 12 IU/L (ref 0–40)
Albumin/Globulin Ratio: 1.6 (ref 1.2–2.2)
Albumin: 4.4 g/dL (ref 3.8–4.8)
Alkaline Phosphatase: 122 IU/L — ABNORMAL HIGH (ref 39–117)
BUN/Creatinine Ratio: 12 (ref 12–28)
BUN: 11 mg/dL (ref 8–27)
Bilirubin Total: 0.6 mg/dL (ref 0.0–1.2)
CO2: 23 mmol/L (ref 20–29)
Calcium: 10.5 mg/dL — ABNORMAL HIGH (ref 8.7–10.3)
Chloride: 105 mmol/L (ref 96–106)
Creatinine, Ser: 0.94 mg/dL (ref 0.57–1.00)
GFR calc Af Amer: 76 mL/min/{1.73_m2} (ref >59)
GFR calc non Af Amer: 66 mL/min/{1.73_m2} (ref >59)
Globulin, Total: 2.7 g/dL (ref 1.5–4.5)
Glucose: 90 mg/dL (ref 65–99)
Potassium: 3.7 mmol/L (ref 3.5–5.2)
Sodium: 144 mmol/L (ref 134–144)
Total Protein: 7.1 g/dL (ref 6.0–8.5)

## 2018-12-23 LAB — LIPID PANEL
Chol/HDL Ratio: 3.5 ratio (ref 0.0–4.4)
Cholesterol, Total: 254 mg/dL — ABNORMAL HIGH (ref 100–199)
HDL: 73 mg/dL (ref >39)
LDL Chol Calc (NIH): 165 mg/dL — ABNORMAL HIGH (ref 0–99)
Triglycerides: 94 mg/dL (ref 0–149)
VLDL Cholesterol Cal: 16 mg/dL (ref 5–40)

## 2018-12-23 LAB — HEMOGLOBIN A1C
Est. average glucose Bld gHb Est-mCnc: 128 mg/dL
Hgb A1c MFr Bld: 6.1 % — ABNORMAL HIGH (ref 4.8–5.6)

## 2018-12-23 LAB — TSH: TSH: 1.55 u[IU]/mL (ref 0.450–4.500)

## 2018-12-28 ENCOUNTER — Other Ambulatory Visit: Payer: Self-pay | Admitting: Internal Medicine

## 2018-12-28 DIAGNOSIS — I10 Essential (primary) hypertension: Secondary | ICD-10-CM

## 2018-12-29 ENCOUNTER — Other Ambulatory Visit: Payer: Self-pay

## 2018-12-29 DIAGNOSIS — I10 Essential (primary) hypertension: Secondary | ICD-10-CM

## 2018-12-29 MED ORDER — HYDROCHLOROTHIAZIDE 25 MG PO TABS: 25.0000 mg | ORAL_TABLET | Freq: Every day | ORAL | 1 refills | Status: DC

## 2019-03-28 ENCOUNTER — Other Ambulatory Visit: Payer: Self-pay

## 2019-03-28 DIAGNOSIS — Z1231 Encounter for screening mammogram for malignant neoplasm of breast: Secondary | ICD-10-CM

## 2019-06-26 ENCOUNTER — Telehealth: Payer: Self-pay

## 2019-06-26 ENCOUNTER — Ambulatory Visit: Payer: BC Managed Care – PPO | Admitting: Internal Medicine

## 2019-06-26 NOTE — Telephone Encounter (Signed)
Called and tried to remind pt she is due for colonoscopy. Unable to reach her- VM not set up yet.   CM

## 2019-08-06 ENCOUNTER — Other Ambulatory Visit: Payer: Self-pay

## 2019-08-06 ENCOUNTER — Encounter: Payer: Self-pay | Admitting: Internal Medicine

## 2019-08-06 ENCOUNTER — Ambulatory Visit: Payer: Self-pay | Admitting: *Deleted

## 2019-08-06 ENCOUNTER — Ambulatory Visit (INDEPENDENT_AMBULATORY_CARE_PROVIDER_SITE_OTHER): Payer: BC Managed Care – PPO | Admitting: Internal Medicine

## 2019-08-06 VITALS — BP 130/78 | HR 85 | Temp 98.6°F | Ht 64.0 in | Wt 209.0 lb

## 2019-08-06 DIAGNOSIS — M25473 Effusion, unspecified ankle: Secondary | ICD-10-CM

## 2019-08-06 DIAGNOSIS — I10 Essential (primary) hypertension: Secondary | ICD-10-CM | POA: Diagnosis not present

## 2019-08-06 DIAGNOSIS — R42 Dizziness and giddiness: Secondary | ICD-10-CM

## 2019-08-06 NOTE — Telephone Encounter (Signed)
Pt called with complaints of foot swelling R>L for 2-3 weeks;she has been taking her HCTZ 25 mg daily; the pt says that she had BP issues in the past but her feet have nerver swollen; the pt says she can wear shoes "for a little while but most of the time she leaves work due to swelling and pain in her feet"; she rates her pain at 6-7 out of 10 the pt says she checked her BP at [facility] but she is not sure of the reading; the pt also states that she has SOB for "a couple of weeks when she walks long distances; recommendations made per  nurse triage protocol; the pt states that she does not want to go to the ED; she would like to be seen office by Dr Army Melia, [facility]; per Alonza Smoker, ok to schedule pt for office visit; decision tree completed; pt offered and accept appt with Dr Army Melia 08/06/19 at 1520; she verbalized understanding;will route to office for notification.  Reason for Disposition . [1] Difficulty breathing with exertion (e.g., walking) AND [2] new onset or worsening  Answer Assessment - Initial Assessment Questions 1. ONSET: "When did the swelling start?" (e.g., minutes, hours, days)     07/16/19 2. LOCATION: "What part of the leg is swollen?"  "Are both legs swollen or just one leg?"     Both feet R>L 3. SEVERITY: "How bad is the swelling?" (e.g., localized; mild, moderate, severe)  - Localized - small area of swelling localized to one leg  - MILD pedal edema - swelling limited to foot and ankle, pitting edema < 1/4 inch (6 mm) deep, rest and elevation eliminate most or all swelling  - MODERATE edema - swelling of lower leg to knee, pitting edema > 1/4 inch (6 mm) deep, rest and elevation only partially reduce swelling  - SEVERE edema - swelling extends above knee, facial or hand swelling present     Moderate; per pt "severe because she has to leave work due to pain and swelling" 4. REDNESS: "Does the swelling look red or infected?"    no 5. PAIN: "Is the swelling painful to touch?"  If so, ask: "How painful is it?"   (Scale 1-10; mild, moderate or severe)     6-7 out of 10 6. FEVER: "Do you have a fever?" If so, ask: "What is it, how was it measured, and when did it start?"      no 7. CAUSE: "What do you think is causing the leg swelling?"     Not sure 8. MEDICAL HISTORY: "Do you have a history of heart failure, kidney disease, liver failure, or cancer?"    HTN 9. RECURRENT SYMPTOM: "Have you had leg swelling before?" If so, ask: "When was the last time?" "What happened that time?"     no 10. OTHER SYMPTOMS: "Do you have any other symptoms?" (e.g., chest pain, difficulty breathing)       Shortness of breath when walking long distances 11. PREGNANCY: "Is there any chance you are pregnant?" "When was your last menstrual period?"  Protocols used: LEG SWELLING AND EDEMA-A-AH

## 2019-08-06 NOTE — Progress Notes (Signed)
Date:  08/06/2019   Name:  Alicia Andersen   DOB:  February 11, 1956   MRN:  [id number]   Chief Complaint: Edema (SOB. Swelling in both ankles but Rt is worse than left X 2-3 weeks. No chest pain.)  Hypertension This is a chronic problem. The problem is controlled. Associated symptoms include shortness of breath (when lying flat - sleeps on 2 pillows). Pertinent negatives include no chest pain, headaches or palpitations. Past treatments include diuretics.   Edema - she has dependent edema, worse during the day and causing discomfort.  It improves overnight.  She has tried compression stockings but they are too hot and bunch around her ankle.  She tries to limit sodium.  She is on her feet all day at work.  Lab Results  Component Value Date   CREATININE 0.94 12/22/2018   BUN 11 12/22/2018   NA 144 12/22/2018   K 3.7 12/22/2018   CL 105 12/22/2018   CO2 23 12/22/2018   Lab Results  Component Value Date   CHOL 254 (H) 12/22/2018   HDL 73 12/22/2018   LDLCALC 165 (H) 12/22/2018   TRIG 94 12/22/2018   CHOLHDL 3.5 12/22/2018   Lab Results  Component Value Date   TSH 1.550 12/22/2018   Lab Results  Component Value Date   HGBA1C 6.1 (H) 12/22/2018   Lab Results  Component Value Date   WBC 7.4 12/22/2018   HGB 13.4 12/22/2018   HCT 41.8 12/22/2018   MCV 87 12/22/2018   PLT 247 12/22/2018   Lab Results  Component Value Date   ALT 7 12/22/2018   AST 12 12/22/2018   ALKPHOS 122 (H) 12/22/2018   BILITOT 0.6 12/22/2018     Review of Systems  Constitutional: Negative for chills, fatigue, fever and unexpected weight change.  HENT: Negative for trouble swallowing.   Respiratory: Positive for shortness of breath (when lying flat - sleeps on 2 pillows). Negative for cough, chest tightness and wheezing.   Cardiovascular: Positive for leg swelling. Negative for chest pain and palpitations.  Gastrointestinal: Negative for abdominal distention and abdominal pain.  Neurological:  Positive for light-headedness. Negative for dizziness, syncope, weakness and headaches.  Psychiatric/Behavioral: Negative for dysphoric mood and sleep disturbance. The patient is not nervous/anxious.     Patient Active Problem List   Diagnosis Date Noted  . Vertigo 10/01/2016  . Ankle edema 04/11/2015  . Plantar fasciitis 12/31/2014  . Dyslipidemia 12/31/2014  . Allergic state 12/31/2014  . Essential (primary) hypertension 12/31/2014  . Arthralgia of hip 12/31/2014  . Prediabetes 12/31/2014  . Migraine without aura and responsive to treatment 12/31/2014  . Arthritis of knee, degenerative 12/31/2014  . Periodic limb movement 12/31/2014  . Apnea, sleep 12/31/2014    No Known Allergies  Past Surgical History:  Procedure Laterality Date  . CATARACT EXTRACTION Left 2016  . ENDOMETRIAL BIOPSY  2005   benign  . KNEE ARTHROSCOPY Right 2009  . TEMPORAL ARTERY BIOPSY / LIGATION  05/2013   negative    Social History   Tobacco Use  . Smoking status: Never Smoker  . Smokeless tobacco: Never Used  Substance Use Topics  . Alcohol use: No    Alcohol/week: 0.0 standard drinks  . Drug use: Not on file     Medication list has been reviewed and updated.  Current Meds  Medication Sig  . acetaminophen (TYLENOL) 500 MG tablet Take by mouth.  Marland Kitchen amitriptyline (ELAVIL) 50 MG tablet Take 1 tablet (50 mg total)  by mouth at bedtime.  . cyclobenzaprine (FLEXERIL) 10 MG tablet Take 1 tablet (10 mg total) by mouth at bedtime.  . fexofenadine (ALLEGRA ALLERGY) 180 MG tablet Take 1 tablet (180 mg total) by mouth daily.  . fluticasone (FLONASE) 50 MCG/ACT nasal spray Place 2 sprays into both nostrils daily.  . hydrochlorothiazide (HYDRODIURIL) 25 MG tablet Take 1 tablet (25 mg total) by mouth daily.  Marland Kitchen ibuprofen (ADVIL,MOTRIN) 600 MG tablet Take 1 tablet (600 mg total) by mouth every 8 (eight) hours as needed.  . pramipexole (MIRAPEX) 0.25 MG tablet Take 1 tablet by mouth at bedtime.  .  propranolol ER (INDERAL LA) 80 MG 24 hr capsule Take 1 capsule (80 mg total) by mouth daily.  . SUMAtriptan (IMITREX) 100 MG tablet Take 1 tablet (100 mg total) by mouth daily as needed.    PHQ 2/9 Scores 08/06/2019 12/22/2018 11/21/2018 05/26/2018  PHQ - 2 Score 0 2 1 0  PHQ- 9 Score 6 9 10  -    BP Readings from Last 3 Encounters:  08/06/19 130/78  12/22/18 126/72  05/26/18 124/70    Physical Exam Vitals and nursing note reviewed.  Constitutional:      General: She is not in acute distress.    Appearance: Normal appearance. She is well-developed.  HENT:     Head: Normocephalic and atraumatic.  Neck:     Vascular: No JVD.  Cardiovascular:     Rate and Rhythm: Normal rate and regular rhythm.  No extrasystoles are present.    Pulses:          Dorsalis pedis pulses are 2+ on the right side and 2+ on the left side.       Posterior tibial pulses are 1+ on the right side and 1+ on the left side.     Heart sounds: Normal heart sounds. No murmur.  Pulmonary:     Effort: Pulmonary effort is normal. No respiratory distress.     Breath sounds: No wheezing or rhonchi.  Abdominal:     General: Abdomen is protuberant. Bowel sounds are normal.     Palpations: Abdomen is soft.     Tenderness: There is no abdominal tenderness.  Musculoskeletal:        General: Normal range of motion.     Cervical back: Normal range of motion.     Right lower leg: 1+ Edema present.     Left lower leg: 1+ Edema present.  Lymphadenopathy:     Cervical: No cervical adenopathy.     Comments: Superficial spider veins in both lower legs Calf muscles non tender, no cord, no homans sign  Skin:    General: Skin is warm and dry.     Findings: No rash.  Neurological:     Mental Status: She is alert and oriented to person, place, and time.  Psychiatric:        Behavior: Behavior normal.        Thought Content: Thought content normal.     Wt Readings from Last 3 Encounters:  08/06/19 209 lb (94.8 kg)  12/22/18  211 lb (95.7 kg)  11/21/18 217 lb (98.4 kg)    BP 130/78   Pulse 85   Temp 98.6 F (37 C) (Oral)   Ht 5\' 4"  (1.626 m)   Wt 209 lb (94.8 kg)   SpO2 99%   BMI 35.87 kg/m   Assessment and Plan: 1. Ankle edema Suspect venous insufficiency Needs graduated compression stocking measured to fit Wear during the  day and remove at night Elevate when able May try HCTZ 50 mg daily  2. Essential (primary) hypertension Clinically stable exam with well controlled BP on HCTZ. Tolerating medications without side effects at this time. Pt to continue current regimen and low sodium diet; benefits of regular exercise as able discussed.  3. Vertigo Chronic daily symptoms   Partially dictated using Editor, commissioning. Any errors are unintentional.  Halina Maidens, MD [facility]  08/06/2019

## 2019-08-06 NOTE — Patient Instructions (Signed)
Try taking 2 HCTZ daily to see if the edema is improved.  Call [facility] about graduated compression stockings that can be measures to fit you

## 2019-11-20 ENCOUNTER — Other Ambulatory Visit: Payer: Self-pay | Admitting: Internal Medicine

## 2019-11-20 DIAGNOSIS — M545 Low back pain, unspecified: Secondary | ICD-10-CM

## 2019-11-20 MED ORDER — IBUPROFEN 600 MG PO TABS: 600.0000 mg | ORAL_TABLET | Freq: Three times a day (TID) | ORAL | 5 refills | Status: DC | PRN

## 2019-11-26 ENCOUNTER — Telehealth: Payer: Self-pay | Admitting: Internal Medicine

## 2019-11-26 NOTE — Telephone Encounter (Signed)
Noted (pt scheduled for appt.)  KP

## 2019-11-26 NOTE — Telephone Encounter (Signed)
Copied from [place] [id number]. Topic: General - Other >> Nov 26, 2019  9:53 AM Rainey Pines A wrote: Patient is wanting to be seen as soon as possible for back pain by Dr Ronnald Ramp. Please advise

## 2019-12-04 ENCOUNTER — Encounter: Payer: Self-pay | Admitting: Internal Medicine

## 2019-12-04 ENCOUNTER — Ambulatory Visit (INDEPENDENT_AMBULATORY_CARE_PROVIDER_SITE_OTHER): Payer: BC Managed Care – PPO | Admitting: Internal Medicine

## 2019-12-04 ENCOUNTER — Other Ambulatory Visit: Payer: Self-pay

## 2019-12-04 VITALS — BP 124/70 | HR 70 | Ht 64.0 in | Wt 227.0 lb

## 2019-12-04 DIAGNOSIS — M47816 Spondylosis without myelopathy or radiculopathy, lumbar region: Secondary | ICD-10-CM | POA: Diagnosis not present

## 2019-12-04 DIAGNOSIS — M778 Other enthesopathies, not elsewhere classified: Secondary | ICD-10-CM | POA: Diagnosis not present

## 2019-12-04 MED ORDER — PREDNISONE 10 MG PO TABS: 10.0000 mg | ORAL_TABLET | ORAL | 0 refills | Status: AC

## 2019-12-04 NOTE — Progress Notes (Signed)
Date:  12/04/2019   Name:  Alicia Andersen   DOB:  1955/12/25   MRN:  [id number]   Chief Complaint: Back Pain (Lower back - ongoing. Not a new problem. ) and Hand Pain (Right hand- top of hand down towards arm. Sore, sharp pain. Started about 2 weeks ago. Getting worse. )  Back Pain This is a chronic problem. The problem is unchanged. The pain is present in the lumbar spine. Pertinent negatives include no chest pain, fever, numbness or weakness. She has tried NSAIDs and muscle relaxant for the symptoms. The treatment provided moderate relief.  Hand Pain  There was no injury mechanism. The pain is present in the right hand. The quality of the pain is described as aching. The pain radiates to the right arm. The pain is mild. The pain has been worsening since the incident. Pertinent negatives include no chest pain or numbness.  Lumbar xray 2017: FINDINGS: Lumbar vertebra numbered with the lowest lumbar shaped segmented appearing vertebra on lateral view as L5. Paraspinal soft tissues normal. Scoliosis lumbar spine concave left. No acute bony abnormality identified. 5 mm anterolisthesis L3 on L4. Diffuse osteopenia. Aortic atherosclerotic vascular calcification.  IMPRESSION: 1. Diffuse multilevel mild degenerative change with 5 mm anterolisthesis L3 on L4.  2. Diffuse osteopenia .  3.  Aortic atherosclerotic vascular disease.  Lab Results  Component Value Date   CREATININE 0.94 12/22/2018   BUN 11 12/22/2018   NA 144 12/22/2018   K 3.7 12/22/2018   CL 105 12/22/2018   CO2 23 12/22/2018   Lab Results  Component Value Date   CHOL 254 (H) 12/22/2018   HDL 73 12/22/2018   LDLCALC 165 (H) 12/22/2018   TRIG 94 12/22/2018   CHOLHDL 3.5 12/22/2018   Lab Results  Component Value Date   TSH 1.550 12/22/2018   Lab Results  Component Value Date   HGBA1C 6.1 (H) 12/22/2018   Lab Results  Component Value Date   WBC 7.4 12/22/2018   HGB 13.4 12/22/2018   HCT 41.8 12/22/2018     MCV 87 12/22/2018   PLT 247 12/22/2018   Lab Results  Component Value Date   ALT 7 12/22/2018   AST 12 12/22/2018   ALKPHOS 122 (H) 12/22/2018   BILITOT 0.6 12/22/2018     Review of Systems  Constitutional: Negative for chills and fever.  Respiratory: Negative for chest tightness and shortness of breath.   Cardiovascular: Negative for chest pain and palpitations.  Musculoskeletal: Positive for arthralgias (hand and arm pain), back pain and gait problem (feels off balance).  Neurological: Negative for dizziness, tremors, speech difficulty, weakness and numbness.    Patient Active Problem List   Diagnosis Date Noted  . Vertigo 10/01/2016  . Ankle edema 04/11/2015  . Plantar fasciitis 12/31/2014  . Dyslipidemia 12/31/2014  . Allergic state 12/31/2014  . Essential (primary) hypertension 12/31/2014  . Arthralgia of hip 12/31/2014  . Prediabetes 12/31/2014  . Migraine without aura and responsive to treatment 12/31/2014  . Arthritis of knee, degenerative 12/31/2014  . Periodic limb movement 12/31/2014  . Apnea, sleep 12/31/2014    No Known Allergies  Past Surgical History:  Procedure Laterality Date  . CATARACT EXTRACTION Left 2016  . ENDOMETRIAL BIOPSY  2005   benign  . KNEE ARTHROSCOPY Right 2009  . TEMPORAL ARTERY BIOPSY / LIGATION  05/2013   negative    Social History   Tobacco Use  . Smoking status: Never Smoker  . Smokeless tobacco: Never  Used  Substance Use Topics  . Alcohol use: No    Alcohol/week: 0.0 standard drinks  . Drug use: Not on file     Medication list has been reviewed and updated.  Current Meds  Medication Sig  . acetaminophen (TYLENOL) 500 MG tablet Take by mouth.  Marland Kitchen amitriptyline (ELAVIL) 50 MG tablet Take 1 tablet (50 mg total) by mouth at bedtime.  . cyclobenzaprine (FLEXERIL) 10 MG tablet Take 1 tablet (10 mg total) by mouth at bedtime.  . fexofenadine (ALLEGRA ALLERGY) 180 MG tablet Take 1 tablet (180 mg total) by mouth daily.   . fluticasone (FLONASE) 50 MCG/ACT nasal spray Place 2 sprays into both nostrils daily.  . hydrochlorothiazide (HYDRODIURIL) 25 MG tablet Take 1 tablet (25 mg total) by mouth daily.  Marland Kitchen ibuprofen (ADVIL) 600 MG tablet Take 1 tablet (600 mg total) by mouth every 8 (eight) hours as needed.  . pramipexole (MIRAPEX) 0.25 MG tablet Take 1 tablet by mouth at bedtime.  . propranolol ER (INDERAL LA) 80 MG 24 hr capsule Take 1 capsule (80 mg total) by mouth daily.  . SUMAtriptan (IMITREX) 100 MG tablet Take 1 tablet (100 mg total) by mouth daily as needed.    PHQ 2/9 Scores 08/06/2019 12/22/2018 11/21/2018 05/26/2018  PHQ - 2 Score 0 2 1 0  PHQ- 9 Score 6 9 10  -    GAD 7 : Generalized Anxiety Score 08/06/2019  Nervous, Anxious, on Edge 2  Control/stop worrying 0  Worry too much - different things 0  Trouble relaxing 0  Restless 0  Easily annoyed or irritable 0  Afraid - awful might happen 0  Total GAD 7 Score 2  Anxiety Difficulty Not difficult at all    BP Readings from Last 3 Encounters:  12/04/19 124/70  08/06/19 130/78  12/22/18 126/72    Physical Exam Constitutional:      Appearance: Normal appearance.  Cardiovascular:     Rate and Rhythm: Normal rate and regular rhythm.     Pulses: Normal pulses.  Pulmonary:     Effort: Pulmonary effort is normal. No respiratory distress.     Breath sounds: No wheezing or rhonchi.  Musculoskeletal:     Right hand: Swelling (third MCP joint) and tenderness present.     Cervical back: Normal range of motion.  Neurological:     Mental Status: She is alert.     Wt Readings from Last 3 Encounters:  12/04/19 227 lb (103 kg)  08/06/19 209 lb (94.8 kg)  12/22/18 211 lb (95.7 kg)    BP 124/70   Pulse 70   Ht 5\' 4"  (1.626 m)   Wt 227 lb (103 kg)   SpO2 98%   BMI 38.96 kg/m   Assessment and Plan: 1. Tendonitis of right hand Recommend ice at the end of the day Topical rubs throughout the day - predniSONE (DELTASONE) 10 MG tablet; Take 1  tablet (10 mg total) by mouth as directed for 6 days. Take 6,5,4,3,2,1 then stop  Dispense: 21 tablet; Refill: 0  2. Spondylosis of lumbar region without myelopathy or radiculopathy Chronic stable low back pain treated with Ibuprofen and flexeril   Partially dictated using Editor, commissioning. Any errors are unintentional.  Halina Maidens, MD [facility]  12/04/2019

## 2019-12-26 ENCOUNTER — Encounter: Payer: Self-pay | Admitting: Internal Medicine

## 2019-12-26 ENCOUNTER — Ambulatory Visit (INDEPENDENT_AMBULATORY_CARE_PROVIDER_SITE_OTHER): Payer: BC Managed Care – PPO | Admitting: Internal Medicine

## 2019-12-26 ENCOUNTER — Other Ambulatory Visit: Payer: Self-pay

## 2019-12-26 VITALS — BP 112/72 | HR 72 | Temp 97.8°F | Ht 64.0 in | Wt 225.0 lb

## 2019-12-26 DIAGNOSIS — Z1211 Encounter for screening for malignant neoplasm of colon: Secondary | ICD-10-CM | POA: Diagnosis not present

## 2019-12-26 DIAGNOSIS — Z1231 Encounter for screening mammogram for malignant neoplasm of breast: Secondary | ICD-10-CM

## 2019-12-26 DIAGNOSIS — R7303 Prediabetes: Secondary | ICD-10-CM

## 2019-12-26 DIAGNOSIS — Z23 Encounter for immunization: Secondary | ICD-10-CM

## 2019-12-26 DIAGNOSIS — Z0000 Encounter for general adult medical examination without abnormal findings: Secondary | ICD-10-CM

## 2019-12-26 DIAGNOSIS — I10 Essential (primary) hypertension: Secondary | ICD-10-CM | POA: Diagnosis not present

## 2019-12-26 LAB — POCT URINALYSIS DIPSTICK
Bilirubin, UA: NEGATIVE
Blood, UA: NEGATIVE
Glucose, UA: NEGATIVE
Ketones, UA: NEGATIVE
Leukocytes, UA: NEGATIVE
Nitrite, UA: NEGATIVE
Protein, UA: NEGATIVE
Spec Grav, UA: 1.01 (ref 1.010–1.025)
Urobilinogen, UA: 0.2 E.U./dL
pH, UA: 6 (ref 5.0–8.0)

## 2019-12-26 NOTE — Progress Notes (Signed)
Date:  12/26/2019   Name:  Alicia Andersen   DOB:  December 18, 1955   MRN:  [id number]   Chief Complaint: Annual Exam (breast exam no pap) and Flu Vaccine  Alicia Andersen is a 64 y.o. female who presents today for her Complete Annual Exam. She feels well. She reports exercising walking when she can . She reports she is sleeping poorly. Breast complaints none.  Mammogram: 02/2019 @ [facility] DEXA: none Pap smear: 09/2017 neg thin prep Colonoscopy: none  Immunization History  Administered Date(s) Administered  . Influenza,inj,Quad PF,6+ Mos 12/22/2018  . Influenza-Unspecified 12/27/2014, 12/27/2017  . PFIZER SARS-COV-2 Vaccination 05/14/2019, 06/03/2019    Hypertension This is a chronic problem. The problem is controlled. Associated symptoms include peripheral edema. Pertinent negatives include no chest pain, orthopnea or palpitations. Past treatments include diuretics and beta blockers. The current treatment provides significant improvement.  Diabetes She presents for her follow-up diabetic visit. Diabetes type: prediabetes. Hypoglycemia symptoms include dizziness. Pertinent negatives for hypoglycemia include no nervousness/anxiousness or tremors. Pertinent negatives for diabetes include no chest pain, no fatigue, no polydipsia and no polyuria. When asked about current treatments, none were reported. Her weight is increasing steadily. An ACE inhibitor/angiotensin II receptor blocker is not being taken.    Lab Results  Component Value Date   CREATININE 0.94 12/22/2018   BUN 11 12/22/2018   NA 144 12/22/2018   K 3.7 12/22/2018   CL 105 12/22/2018   CO2 23 12/22/2018   Lab Results  Component Value Date   CHOL 254 (H) 12/22/2018   HDL 73 12/22/2018   LDLCALC 165 (H) 12/22/2018   TRIG 94 12/22/2018   CHOLHDL 3.5 12/22/2018   Lab Results  Component Value Date   TSH 1.550 12/22/2018   Lab Results  Component Value Date   HGBA1C 6.1 (H) 12/22/2018   Lab Results  Component Value Date    WBC 7.4 12/22/2018   HGB 13.4 12/22/2018   HCT 41.8 12/22/2018   MCV 87 12/22/2018   PLT 247 12/22/2018   Lab Results  Component Value Date   ALT 7 12/22/2018   AST 12 12/22/2018   ALKPHOS 122 (H) 12/22/2018   BILITOT 0.6 12/22/2018     Review of Systems  Constitutional: Positive for unexpected weight change (15+ lbs since last year). Negative for fatigue and fever.  HENT: Positive for tinnitus and voice change. Negative for hearing loss and trouble swallowing.   Eyes: Negative for visual disturbance.  Respiratory: Negative for chest tightness and wheezing.   Cardiovascular: Positive for leg swelling (right leg ). Negative for chest pain, palpitations and orthopnea.  Gastrointestinal: Positive for constipation. Negative for abdominal pain, diarrhea and vomiting.  Endocrine: Negative for polydipsia and polyuria.  Genitourinary: Negative for dysuria, frequency, genital sores, vaginal bleeding and vaginal discharge.  Musculoskeletal: Positive for arthralgias (hand OA and foot pain chronic) and joint swelling (right hand ). Negative for gait problem.  Skin: Negative for color change and rash.  Neurological: Positive for dizziness. Negative for tremors and light-headedness.  Hematological: Negative for adenopathy. Does not bruise/bleed easily.  Psychiatric/Behavioral: Negative for dysphoric mood and sleep disturbance. The patient is not nervous/anxious.     Patient Active Problem List   Diagnosis Date Noted  . Spondylosis of lumbar region without myelopathy or radiculopathy 12/04/2019  . Vertigo 10/01/2016  . Ankle edema 04/11/2015  . Plantar fasciitis 12/31/2014  . Dyslipidemia 12/31/2014  . Allergic state 12/31/2014  . Essential (primary) hypertension 12/31/2014  . Arthralgia of hip  12/31/2014  . Prediabetes 12/31/2014  . Migraine without aura and responsive to treatment 12/31/2014  . Arthritis of knee, degenerative 12/31/2014  . Periodic limb movement 12/31/2014  .  Apnea, sleep 12/31/2014    No Known Allergies  Past Surgical History:  Procedure Laterality Date  . CATARACT EXTRACTION Left 2016  . ENDOMETRIAL BIOPSY  2005   benign  . KNEE ARTHROSCOPY Right 2009  . TEMPORAL ARTERY BIOPSY / LIGATION  05/2013   negative    Social History   Tobacco Use  . Smoking status: Never Smoker  . Smokeless tobacco: Never Used  Substance Use Topics  . Alcohol use: No    Alcohol/week: 0.0 standard drinks  . Drug use: Not on file     Medication list has been reviewed and updated.  Current Meds  Medication Sig  . acetaminophen (TYLENOL) 500 MG tablet Take by mouth.  Marland Kitchen amitriptyline (ELAVIL) 50 MG tablet Take 1 tablet (50 mg total) by mouth at bedtime.  . cyclobenzaprine (FLEXERIL) 10 MG tablet Take 1 tablet (10 mg total) by mouth at bedtime.  . fexofenadine (ALLEGRA ALLERGY) 180 MG tablet Take 1 tablet (180 mg total) by mouth daily.  . fluticasone (FLONASE) 50 MCG/ACT nasal spray Place 2 sprays into both nostrils daily. (Patient taking differently: Place 2 sprays into both nostrils as needed. )  . hydrochlorothiazide (HYDRODIURIL) 25 MG tablet Take 1 tablet (25 mg total) by mouth daily.  Marland Kitchen ibuprofen (ADVIL) 600 MG tablet Take 1 tablet (600 mg total) by mouth every 8 (eight) hours as needed.  . pramipexole (MIRAPEX) 0.25 MG tablet Take 1 tablet by mouth at bedtime.  . propranolol ER (INDERAL LA) 80 MG 24 hr capsule Take 1 capsule (80 mg total) by mouth daily.  . SUMAtriptan (IMITREX) 100 MG tablet Take 1 tablet (100 mg total) by mouth daily as needed.    PHQ 2/9 Scores 12/26/2019 08/06/2019 12/22/2018 11/21/2018  PHQ - 2 Score 0 0 2 1  PHQ- 9 Score 4 6 9 10     GAD 7 : Generalized Anxiety Score 12/26/2019 08/06/2019  Nervous, Anxious, on Edge 1 2  Control/stop worrying 1 0  Worry too much - different things 1 0  Trouble relaxing 1 0  Restless 0 0  Easily annoyed or irritable 0 0  Afraid - awful might happen 0 0  Total GAD 7 Score 4 2  Anxiety  Difficulty Not difficult at all Not difficult at all    BP Readings from Last 3 Encounters:  12/26/19 112/72  12/04/19 124/70  08/06/19 130/78    Physical Exam Vitals and nursing note reviewed.  Constitutional:      General: She is not in acute distress.    Appearance: She is well-developed.  HENT:     Head: Normocephalic and atraumatic.     Right Ear: Tympanic membrane and ear canal normal.     Left Ear: Tympanic membrane and ear canal normal.     Nose:     Right Sinus: No maxillary sinus tenderness.     Left Sinus: No maxillary sinus tenderness.  Eyes:     General: No scleral icterus.       Right eye: No discharge.        Left eye: No discharge.     Conjunctiva/sclera: Conjunctivae normal.  Neck:     Thyroid: No thyromegaly.     Vascular: No carotid bruit.  Cardiovascular:     Rate and Rhythm: Normal rate and regular rhythm.  Pulses: Normal pulses.     Heart sounds: Normal heart sounds.  Pulmonary:     Effort: Pulmonary effort is normal. No respiratory distress.     Breath sounds: No wheezing.  Chest:     Breasts:        Right: No mass, nipple discharge, skin change or tenderness.        Left: No mass, nipple discharge, skin change or tenderness.  Abdominal:     General: Bowel sounds are normal.     Palpations: Abdomen is soft.     Tenderness: There is no abdominal tenderness.  Musculoskeletal:        General: Swelling (right ankle) present.     Cervical back: Normal range of motion. No erythema.     Right lower leg: No edema.     Left lower leg: No edema.  Lymphadenopathy:     Cervical: No cervical adenopathy.  Skin:    General: Skin is warm and dry.     Capillary Refill: Capillary refill takes less than 2 seconds.     Findings: No rash.  Neurological:     General: No focal deficit present.     Mental Status: She is alert and oriented to person, place, and time.     Cranial Nerves: No cranial nerve deficit.     Sensory: No sensory deficit.     Deep  Tendon Reflexes: Reflexes are normal and symmetric.  Psychiatric:        Attention and Perception: Attention normal.        Mood and Affect: Mood normal.        Behavior: Behavior normal.     Wt Readings from Last 3 Encounters:  12/26/19 225 lb (102.1 kg)  12/04/19 227 lb (103 kg)  08/06/19 209 lb (94.8 kg)    BP 112/72 (BP Location: Right Arm, Patient Position: Sitting)   Pulse 72   Temp 97.8 F (36.6 C) (Oral)   Ht 5\' 4"  (1.626 m)   Wt 225 lb (102.1 kg)   SpO2 97%   BMI 38.62 kg/m   Assessment and Plan: 1. Annual physical exam Exam is normal except for weight. Encourage regular exercise and appropriate dietary changes, esp carbohydrate reduction - Hemoglobin A1c - Lipid panel - TSH - POCT urinalysis dipstick  2. Encounter for screening mammogram for breast cancer At [facility] - MM 3D SCREEN BREAST BILATERAL; Future  3. Colon cancer screening [facility] - Ambulatory referral to Gastroenterology  4. Essential (primary) hypertension Clinically stable exam with well controlled BP. Tolerating medications without side effects at this time. Pt to continue current regimen and low sodium diet; benefits of regular exercise as able discussed. - CBC with Differential/Platelet - Comprehensive metabolic panel  5. Prediabetes Stressed need for low carb diet and weight loss to prevent progression to DM  6. Need for immunization against influenza - Flu Vaccine QUAD 36+ mos IM   Partially dictated using Editor, commissioning. Any errors are unintentional.  Halina Maidens, MD [facility]  12/26/2019

## 2019-12-27 LAB — COMPREHENSIVE METABOLIC PANEL
ALT: 13 IU/L (ref 0–32)
AST: 16 IU/L (ref 0–40)
Albumin/Globulin Ratio: 1.7 (ref 1.2–2.2)
Albumin: 4.3 g/dL (ref 3.8–4.8)
Alkaline Phosphatase: 112 IU/L (ref 44–121)
BUN/Creatinine Ratio: 11 — ABNORMAL LOW (ref 12–28)
BUN: 10 mg/dL (ref 8–27)
Bilirubin Total: 0.9 mg/dL (ref 0.0–1.2)
CO2: 25 mmol/L (ref 20–29)
Calcium: 10.2 mg/dL (ref 8.7–10.3)
Chloride: 104 mmol/L (ref 96–106)
Creatinine, Ser: 0.88 mg/dL (ref 0.57–1.00)
GFR calc Af Amer: 81 mL/min/{1.73_m2} (ref >59)
GFR calc non Af Amer: 71 mL/min/{1.73_m2} (ref >59)
Globulin, Total: 2.5 g/dL (ref 1.5–4.5)
Glucose: 91 mg/dL (ref 65–99)
Potassium: 3.9 mmol/L (ref 3.5–5.2)
Sodium: 144 mmol/L (ref 134–144)
Total Protein: 6.8 g/dL (ref 6.0–8.5)

## 2019-12-27 LAB — CBC WITH DIFFERENTIAL/PLATELET
Basophils Absolute: 0.1 10*3/uL (ref 0.0–0.2)
Basos: 1 %
EOS (ABSOLUTE): 0.3 10*3/uL (ref 0.0–0.4)
Eos: 4 %
Hematocrit: 41 % (ref 34.0–46.6)
Hemoglobin: 13.3 g/dL (ref 11.1–15.9)
Immature Grans (Abs): 0 10*3/uL (ref 0.0–0.1)
Immature Granulocytes: 0 %
Lymphocytes Absolute: 2.6 10*3/uL (ref 0.7–3.1)
Lymphs: 33 %
MCH: 28 pg (ref 26.6–33.0)
MCHC: 32.4 g/dL (ref 31.5–35.7)
MCV: 86 fL (ref 79–97)
Monocytes Absolute: 0.7 10*3/uL (ref 0.1–0.9)
Monocytes: 9 %
Neutrophils Absolute: 4.2 10*3/uL (ref 1.4–7.0)
Neutrophils: 53 %
Platelets: 216 10*3/uL (ref 150–450)
RBC: 4.75 x10E6/uL (ref 3.77–5.28)
RDW: 13.4 % (ref 11.7–15.4)
WBC: 7.8 10*3/uL (ref 3.4–10.8)

## 2019-12-27 LAB — LIPID PANEL
Chol/HDL Ratio: 3.8 ratio (ref 0.0–4.4)
Cholesterol, Total: 267 mg/dL — ABNORMAL HIGH (ref 100–199)
HDL: 71 mg/dL (ref >39)
LDL Chol Calc (NIH): 173 mg/dL — ABNORMAL HIGH (ref 0–99)
Triglycerides: 128 mg/dL (ref 0–149)
VLDL Cholesterol Cal: 23 mg/dL (ref 5–40)

## 2019-12-27 LAB — HEMOGLOBIN A1C
Est. average glucose Bld gHb Est-mCnc: 137 mg/dL
Hgb A1c MFr Bld: 6.4 % — ABNORMAL HIGH (ref 4.8–5.6)

## 2019-12-27 LAB — TSH: TSH: 2.06 u[IU]/mL (ref 0.450–4.500)

## 2019-12-28 ENCOUNTER — Ambulatory Visit: Payer: Self-pay | Admitting: *Deleted

## 2019-12-28 NOTE — Telephone Encounter (Signed)
Patient is calling for lab results:  A1C is almost diabetic level - must get serious about losing weight because this will help considerably. Just 10-15 lbs can make the difference. Cholesterol is elevated but no medication is needed yet. Other labs are normal.  Patient notified of lab results and PCP recommendation.  Reason for Disposition . [1] Other NON-URGENT information for PCP AND [2] does not require PCP response  Protocols used: PCP CALL - NO TRIAGE-A-AH

## 2020-01-13 ENCOUNTER — Other Ambulatory Visit: Payer: Self-pay | Admitting: Internal Medicine

## 2020-01-13 DIAGNOSIS — I10 Essential (primary) hypertension: Secondary | ICD-10-CM

## 2020-01-13 NOTE — Telephone Encounter (Signed)
Requested Prescriptions  Pending Prescriptions Disp Refills   hydrochlorothiazide (HYDRODIURIL) 25 MG tablet [Pharmacy Med Name: hydroCHLOROthiazide 25 MG Oral Tablet] 90 tablet 1    Sig: Take 1 tablet by mouth once daily     Cardiovascular: Diuretics - Thiazide Passed - 01/13/2020  2:54 PM      Passed - Ca in normal range and within 360 days    Calcium  Date Value Ref Range Status  12/26/2019 10.2 8.7 - 10.3 mg/dL Final         Passed - Cr in normal range and within 360 days    Creatinine, Ser  Date Value Ref Range Status  12/26/2019 0.88 0.57 - 1.00 mg/dL Final         Passed - K in normal range and within 360 days    Potassium  Date Value Ref Range Status  12/26/2019 3.9 3.5 - 5.2 mmol/L Final         Passed - Na in normal range and within 360 days    Sodium  Date Value Ref Range Status  12/26/2019 144 134 - 144 mmol/L Final         Passed - Last BP in normal range    BP Readings from Last 1 Encounters:  12/26/19 112/72         Passed - Valid encounter within last 6 months    Recent Outpatient Visits          2 weeks ago Annual physical exam   [facility] Glean Hess, MD   1 month ago Tendonitis of right hand   [facility] Glean Hess, MD   5 months ago Ankle edema   [facility] Glean Hess, MD   1 year ago Annual physical exam   [facility] Glean Hess, MD   1 year ago Ankle edema   [facility] Glean Hess, MD      Future Appointments            In 5 months Army Melia Jesse Sans, MD [facility], [place]   In 11 months Army Melia Jesse Sans, MD [facility], [facility]

## 2020-06-17 ENCOUNTER — Ambulatory Visit (INDEPENDENT_AMBULATORY_CARE_PROVIDER_SITE_OTHER): Payer: BC Managed Care – PPO | Admitting: Internal Medicine

## 2020-06-17 ENCOUNTER — Encounter: Payer: Self-pay | Admitting: Internal Medicine

## 2020-06-17 ENCOUNTER — Other Ambulatory Visit: Payer: Self-pay

## 2020-06-17 VITALS — BP 128/70 | HR 75 | Ht 64.0 in | Wt 221.0 lb

## 2020-06-17 DIAGNOSIS — I10 Essential (primary) hypertension: Secondary | ICD-10-CM | POA: Diagnosis not present

## 2020-06-17 DIAGNOSIS — G8929 Other chronic pain: Secondary | ICD-10-CM

## 2020-06-17 DIAGNOSIS — R102 Pelvic and perineal pain: Secondary | ICD-10-CM

## 2020-06-17 DIAGNOSIS — M79671 Pain in right foot: Secondary | ICD-10-CM

## 2020-06-17 DIAGNOSIS — M545 Low back pain, unspecified: Secondary | ICD-10-CM

## 2020-06-17 DIAGNOSIS — R7303 Prediabetes: Secondary | ICD-10-CM | POA: Diagnosis not present

## 2020-06-17 DIAGNOSIS — I700 Atherosclerosis of aorta: Secondary | ICD-10-CM

## 2020-06-17 MED ORDER — HYDROCHLOROTHIAZIDE 25 MG PO TABS: 1.0000 | ORAL_TABLET | Freq: Every day | ORAL | 1 refills | Status: DC

## 2020-06-17 MED ORDER — GABAPENTIN 100 MG PO CAPS: 100.0000 mg | ORAL_CAPSULE | Freq: Every day | ORAL | 0 refills | Status: DC

## 2020-06-17 NOTE — Progress Notes (Signed)
Date:  06/17/2020   Name:  Alicia Andersen   DOB:  21-Aug-1955   MRN:  [id number]   Chief Complaint: Hypertension, Diabetes, and Hip Pain (Right hip pain- X 1 year or more. Says it keeps her up at night. Intermittent.)  Hypertension This is a chronic problem. The problem is controlled. Pertinent negatives include no chest pain, headaches, palpitations or shortness of breath. Past treatments include diuretics. The current treatment provides significant improvement. There are no compliance problems.   Diabetes She presents for her follow-up diabetic visit. Diabetes type: prediabetes. Her disease course has been worsening. Pertinent negatives for hypoglycemia include no dizziness, headaches or nervousness/anxiousness. Pertinent negatives for diabetes include no chest pain, no fatigue and no weakness.  Pelvic Pain The patient's primary symptoms include pelvic pain. This is a chronic problem. The current episode started more than 1 month ago. The problem occurs intermittently. The problem has been unchanged. The problem affects the right side. She is not pregnant. Pertinent negatives include no abdominal pain, constipation, diarrhea, dysuria, headaches or hematuria.  Foot pain - she is seeing Podiatry and has inserts but her right foot still hurts most of the time.  She has some thick nails but they are not painful.  She recently saw Podiatry but did not discuss the pain.  She some mild back and hip pain but it is relieved by Advil.  Lab Results  Component Value Date   CREATININE 0.88 12/26/2019   BUN 10 12/26/2019   NA 144 12/26/2019   K 3.9 12/26/2019   CL 104 12/26/2019   CO2 25 12/26/2019   Lab Results  Component Value Date   CHOL 267 (H) 12/26/2019   HDL 71 12/26/2019   LDLCALC 173 (H) 12/26/2019   TRIG 128 12/26/2019   CHOLHDL 3.8 12/26/2019   Lab Results  Component Value Date   TSH 2.060 12/26/2019   Lab Results  Component Value Date   HGBA1C 6.4 (H) 12/26/2019   Lab Results   Component Value Date   WBC 7.8 12/26/2019   HGB 13.3 12/26/2019   HCT 41.0 12/26/2019   MCV 86 12/26/2019   PLT 216 12/26/2019   Lab Results  Component Value Date   ALT 13 12/26/2019   AST 16 12/26/2019   ALKPHOS 112 12/26/2019   BILITOT 0.9 12/26/2019     Review of Systems  Constitutional: Negative for fatigue and unexpected weight change.  HENT: Negative for nosebleeds and trouble swallowing.   Eyes: Negative for visual disturbance.  Respiratory: Negative for cough, chest tightness, shortness of breath and wheezing.   Cardiovascular: Negative for chest pain, palpitations and leg swelling.  Gastrointestinal: Negative for abdominal pain, constipation and diarrhea.  Genitourinary: Positive for pelvic pain. Negative for dysuria, genital sores and hematuria.  Musculoskeletal: Positive for arthralgias (foot pain on right) and gait problem.  Neurological: Negative for dizziness, weakness, light-headedness and headaches.  Psychiatric/Behavioral: Negative for dysphoric mood and sleep disturbance. The patient is not nervous/anxious.     Patient Active Problem List   Diagnosis Date Noted  . Spondylosis of lumbar region without myelopathy or radiculopathy 12/04/2019  . Vertigo 10/01/2016  . Ankle edema 04/11/2015  . Plantar fasciitis 12/31/2014  . Dyslipidemia 12/31/2014  . Allergic state 12/31/2014  . Essential (primary) hypertension 12/31/2014  . Arthralgia of hip 12/31/2014  . Prediabetes 12/31/2014  . Migraine without aura and responsive to treatment 12/31/2014  . Arthritis of knee, degenerative 12/31/2014  . Periodic limb movement 12/31/2014  . Apnea, sleep  12/31/2014    No Known Allergies  Past Surgical History:  Procedure Laterality Date  . CATARACT EXTRACTION Left 2016  . ENDOMETRIAL BIOPSY  2005   benign  . KNEE ARTHROSCOPY Right 2009  . TEMPORAL ARTERY BIOPSY / LIGATION  05/2013   negative    Social History   Tobacco Use  . Smoking status: Never Smoker   . Smokeless tobacco: Never Used  Substance Use Topics  . Alcohol use: No    Alcohol/week: 0.0 standard drinks     Medication list has been reviewed and updated.  Current Meds  Medication Sig  . acetaminophen (TYLENOL) 500 MG tablet Take by mouth.  . fexofenadine (ALLEGRA ALLERGY) 180 MG tablet Take 1 tablet (180 mg total) by mouth daily.  . fluticasone (FLONASE) 50 MCG/ACT nasal spray Place 2 sprays into both nostrils daily. (Patient taking differently: Place 2 sprays into both nostrils as needed.)  . hydrochlorothiazide (HYDRODIURIL) 25 MG tablet Take 1 tablet by mouth once daily  . ibuprofen (ADVIL) 600 MG tablet Take 1 tablet (600 mg total) by mouth every 8 (eight) hours as needed.  . pramipexole (MIRAPEX) 0.25 MG tablet Take 1 tablet by mouth at bedtime.    PHQ 2/9 Scores 06/17/2020 12/26/2019 08/06/2019 12/22/2018  PHQ - 2 Score 2 0 0 2  PHQ- 9 Score 6 4 6 9     GAD 7 : Generalized Anxiety Score 06/17/2020 12/26/2019 08/06/2019  Nervous, Anxious, on Edge 1 1 2   Control/stop worrying 1 1 0  Worry too much - different things 1 1 0  Trouble relaxing 2 1 0  Restless 0 0 0  Easily annoyed or irritable 0 0 0  Afraid - awful might happen 0 0 0  Total GAD 7 Score 5 4 2   Anxiety Difficulty Not difficult at all Not difficult at all Not difficult at all    BP Readings from Last 3 Encounters:  06/17/20 128/70  12/26/19 112/72  12/04/19 124/70    Physical Exam Vitals and nursing note reviewed.  Constitutional:      General: She is not in acute distress.    Appearance: She is well-developed.  HENT:     Head: Normocephalic and atraumatic.  Cardiovascular:     Rate and Rhythm: Normal rate and regular rhythm.     Pulses: Normal pulses.          Dorsalis pedis pulses are 2+ on the right side and 2+ on the left side.     Heart sounds: No murmur heard.   Pulmonary:     Effort: Pulmonary effort is normal. No respiratory distress.     Breath sounds: No wheezing or rhonchi.   Abdominal:     General: Abdomen is protuberant. Bowel sounds are normal.     Palpations: Abdomen is soft. There is no hepatomegaly.     Tenderness: There is abdominal tenderness (mild discomfort to palpation). There is no right CVA tenderness or left CVA tenderness.     Hernia: No hernia is present.  Musculoskeletal:     Lumbar back: No spasms, tenderness or bony tenderness. Negative right straight leg raise test and negative left straight leg raise test.     Right hip: Normal. No tenderness or bony tenderness. Normal range of motion.     Right knee: Normal. No swelling, effusion or bony tenderness.     Right lower leg: No edema.     Left lower leg: No edema.     Right foot: Normal range of motion.  Deformity (loss of arch) present. No foot drop.     Left foot: Normal range of motion. No foot drop.  Feet:     Right foot:     Skin integrity: Skin integrity normal.     Left foot:     Skin integrity: Skin integrity normal.  Skin:    General: Skin is warm and dry.     Findings: No rash.  Neurological:     Mental Status: She is alert and oriented to person, place, and time.  Psychiatric:        Mood and Affect: Mood normal.        Behavior: Behavior normal.     Wt Readings from Last 3 Encounters:  06/17/20 221 lb (100.2 kg)  12/26/19 225 lb (102.1 kg)  12/04/19 227 lb (103 kg)    BP 128/70   Pulse 75   Ht 5\' 4"  (1.626 m)   Wt 221 lb (100.2 kg)   SpO2 100%   BMI 37.93 kg/m   Assessment and Plan: 1. Essential hypertension Clinically stable exam with well controlled BP. Tolerating medications without side effects at this time. Pt to continue current regimen and low sodium diet; benefits of regular exercise as able discussed. - hydrochlorothiazide (HYDRODIURIL) 25 MG tablet; Take 1 tablet (25 mg total) by mouth daily.  Dispense: 90 tablet; Refill: 1 - Basic metabolic panel  2. Prediabetes Continue efforts at diet changes and weight loss Has lost a few pounds and will  continue portion control and exercise as able - Hemoglobin A1c  3. Chronic foot pain, right Likely due to poor foot mechanics and weight Will try gabapentin 100 mg at HS - titrate up to 300 mg if needed - gabapentin (NEURONTIN) 100 MG capsule; Take 1-3 capsules (100-300 mg total) by mouth at bedtime.  Dispense: 90 capsule; Refill: 0  4. Chronic midline low back pain, unspecified whether sciatica present Mild OA noted on previous films Does not appear to be causing much symptoms today  5. Pelvic pain Post menopausal without any bleeding - US Pelvic Complete With Transvaginal; Future  6. Aortic atherosclerosis ([place]) Not currently on statin medication Will discuss at next visit; 10 yr risk 6%   Partially dictated using Editor, commissioning. Any errors are unintentional.  Halina Maidens, MD [facility]  06/17/2020   Partially dictated using Dragon software. Any errors are unintentional.  Halina Maidens, MD [facility]  06/17/2020

## 2020-06-17 NOTE — Patient Instructions (Addendum)
At your earliest convenience, please contact [place] at [phone] to schedule your appointment for colonoscopy.

## 2020-06-18 LAB — HEMOGLOBIN A1C
Est. average glucose Bld gHb Est-mCnc: 128 mg/dL
Hgb A1c MFr Bld: 6.1 % — ABNORMAL HIGH (ref 4.8–5.6)

## 2020-06-18 LAB — BASIC METABOLIC PANEL
BUN/Creatinine Ratio: 11 — ABNORMAL LOW (ref 12–28)
BUN: 12 mg/dL (ref 8–27)
CO2: 24 mmol/L (ref 20–29)
Calcium: 10.6 mg/dL — ABNORMAL HIGH (ref 8.7–10.3)
Chloride: 106 mmol/L (ref 96–106)
Creatinine, Ser: 1.14 mg/dL — ABNORMAL HIGH (ref 0.57–1.00)
Glucose: 86 mg/dL (ref 65–99)
Potassium: 4.2 mmol/L (ref 3.5–5.2)
Sodium: 147 mmol/L — ABNORMAL HIGH (ref 134–144)
eGFR: 54 mL/min/{1.73_m2} — ABNORMAL LOW (ref >59)

## 2020-06-20 NOTE — Progress Notes (Signed)
Review.

## 2020-07-18 ENCOUNTER — Ambulatory Visit
Admission: RE | Admit: 2020-07-18 | Discharge: 2020-07-18 | Disposition: A | Discharge status: Home / Self Care | Payer: BC Managed Care – PPO | Source: Ambulatory Visit | Attending: Internal Medicine | Admitting: Internal Medicine

## 2020-07-18 ENCOUNTER — Other Ambulatory Visit: Payer: Self-pay

## 2020-07-18 DIAGNOSIS — R102 Pelvic and perineal pain: Secondary | ICD-10-CM | POA: Insufficient documentation

## 2020-07-21 NOTE — Progress Notes (Signed)
Will route this note to [facility] Nurse just in case the patient returns call to clinic. [facility] Nurse may give patient results if pt returns the call.  CRM has also been created for this msg for call center purposes. Please attach any notes regarding this lab to this result message and do not create a new CRM.   KP

## 2020-08-02 LAB — HM MAMMOGRAPHY

## 2020-08-31 NOTE — Addendum Note (Signed)
Encounter addended by: Annie Paras on: 08/31/2020 5:49 PM  Actions taken: Letter saved

## 2020-09-29 ENCOUNTER — Ambulatory Visit: Payer: BC Managed Care – PPO | Admitting: Family Medicine

## 2020-09-29 ENCOUNTER — Other Ambulatory Visit: Payer: Self-pay

## 2020-09-29 ENCOUNTER — Ambulatory Visit (INDEPENDENT_AMBULATORY_CARE_PROVIDER_SITE_OTHER): Payer: BC Managed Care – PPO | Admitting: Family Medicine

## 2020-09-29 ENCOUNTER — Encounter: Payer: Self-pay | Admitting: Family Medicine

## 2020-09-29 VITALS — BP 154/76 | HR 76 | Temp 98.4°F | Ht 64.0 in | Wt 217.0 lb

## 2020-09-29 DIAGNOSIS — J329 Chronic sinusitis, unspecified: Secondary | ICD-10-CM | POA: Diagnosis not present

## 2020-09-29 DIAGNOSIS — R0982 Postnasal drip: Secondary | ICD-10-CM | POA: Diagnosis not present

## 2020-09-29 DIAGNOSIS — J310 Chronic rhinitis: Secondary | ICD-10-CM

## 2020-09-29 LAB — POCT INFLUENZA A/B
Influenza A, POC: NEGATIVE
Influenza B, POC: NEGATIVE

## 2020-09-29 MED ORDER — AMOXICILLIN 875 MG PO TABS: 875.0000 mg | ORAL_TABLET | Freq: Two times a day (BID) | ORAL | 0 refills | Status: DC

## 2020-09-29 NOTE — Patient Instructions (Addendum)
-   Start 2 sprays Nasacort into clean nostrils daily x 10 days - Dose Augmentin for full course - Use Claritin, Mucinex as you have been for 10 days - We will contact you with flu / COVID results - Contact us for any lingering symptoms despite the above at the 10 day mark or beyond

## 2020-09-29 NOTE — Progress Notes (Signed)
Primary Care / Sports Medicine Office Visit  Patient Information:  Patient ID: Alicia Andersen, female DOB: Mar 11, 1956 Age: 65 y.o. MRN: [id number]   Alicia Andersen is a pleasant 65 y.o. female presenting with the following:  Chief Complaint  Patient presents with   Cough    X2 weeks; dry; chest pain and chills associated with coughing; taking Mucinex DM twice daily, Tylenol 1000 mg, Flonase, and Allegra with no relief    Review of Systems pertinent details above   Patient Active Problem List   Diagnosis Date Noted   Rhinosinusitis 09/29/2020   Spondylosis of lumbar region without myelopathy or radiculopathy 12/04/2019   Vertigo 10/01/2016   Ankle edema 04/11/2015   Plantar fasciitis 12/31/2014   Aortic atherosclerosis ([place]) 12/31/2014   Allergic state 12/31/2014   Essential (primary) hypertension 12/31/2014   Arthralgia of hip 12/31/2014   Prediabetes 12/31/2014   Migraine without aura and responsive to treatment 12/31/2014   Arthritis of knee, degenerative 12/31/2014   Periodic limb movement 12/31/2014   Apnea, sleep 12/31/2014   Past Medical History:  Diagnosis Date   Allergy    Generalized headaches    Hypertension    Restless leg syndrome    Outpatient Encounter Medications as of 09/29/2020  Medication Sig   acetaminophen (TYLENOL) 500 MG tablet Take 1,000 mg by mouth every 6 (six) hours as needed for moderate pain.   amoxicillin (AMOXIL) 875 MG tablet Take 1 tablet (875 mg total) by mouth 2 (two) times daily for 10 days.   dextromethorphan-guaiFENesin (MUCINEX DM) 30-600 MG 12hr tablet Take 1 tablet by mouth 2 (two) times daily.   fexofenadine (ALLEGRA ALLERGY) 180 MG tablet Take 1 tablet (180 mg total) by mouth daily.   fluticasone (FLONASE) 50 MCG/ACT nasal spray Place 2 sprays into both nostrils daily. (Patient taking differently: Place 2 sprays into both nostrils as needed.)   gabapentin (NEURONTIN) 100 MG capsule Take 1-3 capsules (100-300 mg total) by  mouth at bedtime.   hydrochlorothiazide (HYDRODIURIL) 25 MG tablet Take 1 tablet (25 mg total) by mouth daily.   ibuprofen (ADVIL) 600 MG tablet Take 1 tablet (600 mg total) by mouth every 8 (eight) hours as needed.   pramipexole (MIRAPEX) 0.25 MG tablet Take 1 tablet by mouth at bedtime.   No facility-administered encounter medications on file as of 09/29/2020.   Past Surgical History:  Procedure Laterality Date   CATARACT EXTRACTION Left 2016   ENDOMETRIAL BIOPSY  2005   benign   KNEE ARTHROSCOPY Right 2009   TEMPORAL ARTERY BIOPSY / LIGATION  05/2013   negative    Vitals:   09/29/20 1325  BP: (!) 154/76  Pulse: 76  Temp: 98.4 F (36.9 C)  SpO2: 94%   Vitals:   09/29/20 1325  Weight: 217 lb (98.4 kg)  Height: 5\' 4"  (1.626 m)   Body mass index is 37.25 kg/m.  No results found.   Independent interpretation of notes and tests performed by another provider:   None  Procedures performed:   None  Pertinent History, Exam, Impression, and Recommendations:   Rhinosinusitis 2-week history of postnasal drip, cough, subjective chills, and sick contacts who have tested negative for COVID.  She denies any fevers, no purulent drainage, does have sinus, throat, and ear pain.  Physical exam is significant for right ethmoid/maxillary tenderness, symmetric frontal sinus tenderness, positive right-sided lymphadenopathy at the anterior cervical chain, her lung fields are clear throughout and she is utilizing no support respiration.  Have advised  a 10-day course of Nasacort, Mucinex, Claritin, and 7 days of Augmentin 875 mg twice daily.  Additionally, we did swab for flu and COVID today.  Pending results of the swabs, we will modify therapy otherwise she is to contact us at the 10-day mark for any suboptimal progress.  If noted, additional pharmacotherapy versus evaluation options to be considered such as chest x-ray.   Orders & Medications Meds ordered this encounter  Medications    amoxicillin (AMOXIL) 875 MG tablet    Sig: Take 1 tablet (875 mg total) by mouth 2 (two) times daily for 10 days.    Dispense:  14 tablet    Refill:  0   Orders Placed This Encounter  Procedures   Novel Coronavirus, NAA ([facility])   POCT Influenza A/B     No follow-ups on file.     Montel Culver, MD   Primary Care Sports Medicine [place]

## 2020-09-29 NOTE — Progress Notes (Signed)
Flu A & B negative

## 2020-09-29 NOTE — Assessment & Plan Note (Signed)
2-week history of postnasal drip, cough, subjective chills, and sick contacts who have tested negative for COVID.  She denies any fevers, no purulent drainage, does have sinus, throat, and ear pain.  Physical exam is significant for right ethmoid/maxillary tenderness, symmetric frontal sinus tenderness, positive right-sided lymphadenopathy at the anterior cervical chain, her lung fields are clear throughout and she is utilizing no support respiration.  Have advised a 10-day course of Nasacort, Mucinex, Claritin, and 7 days of Augmentin 875 mg twice daily.  Additionally, we did swab for flu and COVID today.  Pending results of the swabs, we will modify therapy otherwise she is to contact us at the 10-day mark for any suboptimal progress.  If noted, additional pharmacotherapy versus evaluation options to be considered such as chest x-ray.

## 2020-09-30 ENCOUNTER — Other Ambulatory Visit: Payer: Self-pay | Admitting: Family Medicine

## 2020-09-30 DIAGNOSIS — U071 COVID-19: Secondary | ICD-10-CM

## 2020-09-30 LAB — NOVEL CORONAVIRUS, NAA: SARS-CoV-2, NAA: DETECTED — AB

## 2020-09-30 LAB — SARS-COV-2, NAA 2 DAY TAT

## 2020-09-30 MED ORDER — MOLNUPIRAVIR EUA 200MG CAPSULE: 4.0000 | ORAL_CAPSULE | Freq: Two times a day (BID) | ORAL | 0 refills | Status: AC

## 2020-09-30 NOTE — Progress Notes (Signed)
Did leave voicemail and send molnupiravir to pharmacy given her risk factors, please see note in chart and try to contact patient with updates.

## 2020-09-30 NOTE — Progress Notes (Signed)
    Primary Care / Sports Medicine Telephone Note  Patient Information:  Patient ID: Alicia Andersen, female DOB: Jan 19, 1956 Age: 65 y.o. MRN: [id number]    Attempted to contact patient and left voicemail describing recent positive COVID-19 results. I advised her to stop previous antibiotics and start new molnupiravir which was sent to her pharmacy (I did call the pharmacy to confirm availability prior to Rx). She was advised to contact us for questions or worsening symptoms despite the treatments outlined.  Montel Culver, MD   Primary Care Sports Medicine [place]

## 2020-10-01 ENCOUNTER — Telehealth: Payer: Self-pay

## 2020-10-01 NOTE — Telephone Encounter (Signed)
Copied from [place] [id number]. Topic: General - Call Back - No Documentation >> Oct 01, 2020 11:19 AM Lennox Solders wrote: Reason for CRM: Pt sister is returning a call and would like to speak with chassidy concerning her sister has covid >> Oct 01, 2020 11:23 AM Lennox Solders wrote: Pt sister name is verne

## 2020-10-01 NOTE — Telephone Encounter (Signed)
Spoke with sister Maricela Curet who believes she may covid as well. Scheduled for visit tomorrow

## 2020-10-08 ENCOUNTER — Other Ambulatory Visit: Payer: Self-pay

## 2020-10-08 ENCOUNTER — Encounter: Payer: BC Managed Care – PPO | Admitting: Internal Medicine

## 2020-10-08 ENCOUNTER — Telehealth: Payer: Self-pay

## 2020-10-08 ENCOUNTER — Encounter: Payer: Self-pay | Admitting: Internal Medicine

## 2020-10-08 NOTE — Progress Notes (Signed)
No visit

## 2020-10-10 NOTE — Telephone Encounter (Signed)
error 

## 2020-10-13 ENCOUNTER — Other Ambulatory Visit: Payer: Self-pay

## 2020-10-13 ENCOUNTER — Encounter: Payer: Self-pay | Admitting: Internal Medicine

## 2020-10-13 ENCOUNTER — Telehealth (INDEPENDENT_AMBULATORY_CARE_PROVIDER_SITE_OTHER): Payer: BC Managed Care – PPO | Admitting: Internal Medicine

## 2020-10-13 VITALS — Ht 64.0 in

## 2020-10-13 DIAGNOSIS — U071 COVID-19: Secondary | ICD-10-CM

## 2020-10-13 DIAGNOSIS — J0100 Acute maxillary sinusitis, unspecified: Secondary | ICD-10-CM

## 2020-10-13 MED ORDER — AMOXICILLIN-POT CLAVULANATE 875-125 MG PO TABS: 1.0000 | ORAL_TABLET | Freq: Two times a day (BID) | ORAL | 0 refills | Status: AC

## 2020-10-13 NOTE — Progress Notes (Signed)
Date:  10/13/2020   Name:  Alicia Andersen   DOB:  1955/12/08   MRN:  [id number]  This encounter was conducted via video encounter due to the need for social distancing in light of the Covid-19 pandemic.  The patient was correctly identified.  I advised that I am conducting the visit from a secure room in my office at [facility] clinic.  The patient is located at home. The limitations of this form of encounter were discussed with the patient and he/she agreed to proceed.  Some vital signs will be absent.  Her sister Maricela Curet is present during the visit.  Chief Complaint: Covid Positive (Cough with no mucous, fatigue, SOB, low grade fever 99, pt says she can always try to go back to work but still having symptoms) She was seen on 09/30/20 and treated for sinusitis.  However her Covid test was positive and she was then treated with Molnupiravir which she completed a week ago.  She is still running a low grade fever to 100 and has headache and sinus congestion. HPI  Lab Results  Component Value Date   CREATININE 1.14 (H) 06/17/2020   BUN 12 06/17/2020   NA 147 (H) 06/17/2020   K 4.2 06/17/2020   CL 106 06/17/2020   CO2 24 06/17/2020   Lab Results  Component Value Date   CHOL 267 (H) 12/26/2019   HDL 71 12/26/2019   LDLCALC 173 (H) 12/26/2019   TRIG 128 12/26/2019   CHOLHDL 3.8 12/26/2019   Lab Results  Component Value Date   TSH 2.060 12/26/2019   Lab Results  Component Value Date   HGBA1C 6.1 (H) 06/17/2020   Lab Results  Component Value Date   WBC 7.8 12/26/2019   HGB 13.3 12/26/2019   HCT 41.0 12/26/2019   MCV 86 12/26/2019   PLT 216 12/26/2019   Lab Results  Component Value Date   ALT 13 12/26/2019   AST 16 12/26/2019   ALKPHOS 112 12/26/2019   BILITOT 0.9 12/26/2019     Review of Systems  Constitutional:  Positive for chills and fever. Negative for fatigue.  HENT:  Positive for congestion, postnasal drip, sinus pressure and sinus pain.   Respiratory:   Positive for cough. Negative for chest tightness and wheezing.   Cardiovascular:  Negative for chest pain and leg swelling.  Gastrointestinal:  Negative for diarrhea and vomiting.  Neurological:  Positive for headaches. Negative for dizziness.   Patient Active Problem List   Diagnosis Date Noted   Rhinosinusitis 09/29/2020   Spondylosis of lumbar region without myelopathy or radiculopathy 12/04/2019   Vertigo 10/01/2016   Ankle edema 04/11/2015   Plantar fasciitis 12/31/2014   Aortic atherosclerosis ([place]) 12/31/2014   Allergic state 12/31/2014   Essential (primary) hypertension 12/31/2014   Arthralgia of hip 12/31/2014   Prediabetes 12/31/2014   Migraine without aura and responsive to treatment 12/31/2014   Arthritis of knee, degenerative 12/31/2014   Periodic limb movement 12/31/2014   Apnea, sleep 12/31/2014    No Known Allergies  Past Surgical History:  Procedure Laterality Date   CATARACT EXTRACTION Left 2016   ENDOMETRIAL BIOPSY  2005   benign   KNEE ARTHROSCOPY Right 2009   TEMPORAL ARTERY BIOPSY / LIGATION  05/2013   negative    Social History   Tobacco Use   Smoking status: Never   Smokeless tobacco: Never  Vaping Use   Vaping Use: Never used  Substance Use Topics   Alcohol use: No  Alcohol/week: 0.0 standard drinks     Medication list has been reviewed and updated.  Current Meds  Medication Sig   acetaminophen (TYLENOL) 500 MG tablet Take 1,000 mg by mouth every 6 (six) hours as needed for moderate pain.   amoxicillin-clavulanate (AUGMENTIN) 875-125 MG tablet Take 1 tablet by mouth 2 (two) times daily for 10 days.   dextromethorphan-guaiFENesin (MUCINEX DM) 30-600 MG 12hr tablet Take 1 tablet by mouth 2 (two) times daily.   fexofenadine (ALLEGRA ALLERGY) 180 MG tablet Take 1 tablet (180 mg total) by mouth daily.   fluticasone (FLONASE) 50 MCG/ACT nasal spray Place 2 sprays into both nostrils daily. (Patient taking differently: Place 2 sprays into both  nostrils as needed.)   gabapentin (NEURONTIN) 100 MG capsule Take 1-3 capsules (100-300 mg total) by mouth at bedtime.   hydrochlorothiazide (HYDRODIURIL) 25 MG tablet Take 1 tablet (25 mg total) by mouth daily.   ibuprofen (ADVIL) 600 MG tablet Take 1 tablet (600 mg total) by mouth every 8 (eight) hours as needed.   pramipexole (MIRAPEX) 0.25 MG tablet Take 1 tablet by mouth at bedtime.    PHQ 2/9 Scores 10/13/2020 09/29/2020 06/17/2020 12/26/2019  PHQ - 2 Score 0 2 2 0  PHQ- 9 Score '2 11 6 4    '$ GAD 7 : Generalized Anxiety Score 10/13/2020 09/29/2020 06/17/2020 12/26/2019  Nervous, Anxious, on Edge 1 0 1 1  Control/stop worrying 0 0 1 1  Worry too much - different things 0 0 1 1  Trouble relaxing 0 '1 2 1  '$ Restless 0 1 0 0  Easily annoyed or irritable 0 1 0 0  Afraid - awful might happen 0 1 0 0  Total GAD 7 Score '1 4 5 4  '$ Anxiety Difficulty - Not difficult at all Not difficult at all Not difficult at all    BP Readings from Last 3 Encounters:  09/29/20 (!) 154/76  06/17/20 128/70  12/26/19 112/72    Physical Exam Constitutional:      Appearance: She is ill-appearing.  Pulmonary:     Effort: Pulmonary effort is normal.  Neurological:     Mental Status: She is alert.    Wt Readings from Last 3 Encounters:  09/29/20 217 lb (98.4 kg)  06/17/20 221 lb (100.2 kg)  12/26/19 225 lb (102.1 kg)    Ht '5\' 4"'$  (1.626 m)   BMI 37.25 kg/m   Assessment and Plan: 1. COVID-19 Treated with antivirals but still with low grade fever, head congestion Remain out of work until fever has resolved and tylenol is no longer needed for at least three day  2. Subacute maxillary sinusitis Suspected sinusitis was not completely treated with Doxycycline Continue Allegra, Flonase and Mucinex fluids - amoxicillin-clavulanate (AUGMENTIN) 875-125 MG tablet; Take 1 tablet by mouth 2 (two) times daily for 10 days.  Dispense: 20 tablet; Refill: 0  I spent 8 minutes on this encounter. Partially dictated  using Editor, commissioning. Any errors are unintentional.  Halina Maidens, MD [facility]  10/13/2020

## 2020-10-14 NOTE — Addendum Note (Signed)
Addended by: Yasmine Kilbourne H on: 10/14/2020 07:24 AM   Modules accepted: Level of Service  

## 2020-12-31 ENCOUNTER — Other Ambulatory Visit (HOSPITAL_COMMUNITY)
Admission: RE | Admit: 2020-12-31 | Discharge: 2020-12-31 | Disposition: A | Discharge status: Home / Self Care | Payer: BC Managed Care – PPO | Source: Ambulatory Visit | Attending: Internal Medicine | Admitting: Internal Medicine

## 2020-12-31 ENCOUNTER — Other Ambulatory Visit: Payer: Self-pay

## 2020-12-31 ENCOUNTER — Encounter: Payer: Self-pay | Admitting: Internal Medicine

## 2020-12-31 ENCOUNTER — Ambulatory Visit (INDEPENDENT_AMBULATORY_CARE_PROVIDER_SITE_OTHER): Payer: BC Managed Care – PPO | Admitting: Internal Medicine

## 2020-12-31 VITALS — BP 122/68 | HR 87 | Ht 64.0 in | Wt 226.2 lb

## 2020-12-31 DIAGNOSIS — Z124 Encounter for screening for malignant neoplasm of cervix: Secondary | ICD-10-CM | POA: Diagnosis not present

## 2020-12-31 DIAGNOSIS — R7303 Prediabetes: Secondary | ICD-10-CM

## 2020-12-31 DIAGNOSIS — I700 Atherosclerosis of aorta: Secondary | ICD-10-CM | POA: Diagnosis not present

## 2020-12-31 DIAGNOSIS — Z0000 Encounter for general adult medical examination without abnormal findings: Secondary | ICD-10-CM | POA: Diagnosis not present

## 2020-12-31 DIAGNOSIS — Z1211 Encounter for screening for malignant neoplasm of colon: Secondary | ICD-10-CM | POA: Diagnosis not present

## 2020-12-31 DIAGNOSIS — Z23 Encounter for immunization: Secondary | ICD-10-CM

## 2020-12-31 DIAGNOSIS — I10 Essential (primary) hypertension: Secondary | ICD-10-CM | POA: Diagnosis not present

## 2020-12-31 LAB — POCT URINALYSIS DIPSTICK
Bilirubin, UA: NEGATIVE
Blood, UA: NEGATIVE
Glucose, UA: NEGATIVE
Ketones, UA: NEGATIVE
Leukocytes, UA: NEGATIVE
Nitrite, UA: NEGATIVE
Protein, UA: NEGATIVE
Spec Grav, UA: 1.015 (ref 1.010–1.025)
Urobilinogen, UA: 0.2 E.U./dL
pH, UA: 6 (ref 5.0–8.0)

## 2020-12-31 NOTE — Progress Notes (Signed)
Date:  12/31/2020   Name:  Alicia Andersen   DOB:  05-11-1955   MRN:  [id number]   Chief Complaint: Annual Exam (Breast Exam. No pap. ) Alicia Andersen is a 65 y.o. female who presents today for her Complete Annual Exam. She feels fairly well. She reports exercising - none. She reports she is sleeping poorly. Breast complaints - none.  Mammogram: 07/2020 DEXA: none Pap smear: 09/2017 thin prep Colonoscopy: referred last year but never f/u  Immunization History  Administered Date(s) Administered   Influenza,inj,Quad PF,6+ Mos 12/22/2018, 12/26/2019   Influenza-Unspecified 12/27/2014, 12/27/2017   Moderna Sars-Covid-2 Vaccination 05/23/2019, 06/22/2019, 02/01/2020    Hypertension This is a chronic problem. The problem is controlled. Pertinent negatives include no chest pain, headaches, palpitations or shortness of breath. Past treatments include diuretics.  Diabetes She presents for her follow-up diabetic visit. Diabetes type: prediabetes. Her disease course has been stable. Pertinent negatives for hypoglycemia include no dizziness, headaches, nervousness/anxiousness or tremors. Pertinent negatives for diabetes include no chest pain, no fatigue, no polydipsia and no polyuria. Current diabetic treatment includes diet.   Lab Results  Component Value Date   CREATININE 1.14 (H) 06/17/2020   BUN 12 06/17/2020   NA 147 (H) 06/17/2020   K 4.2 06/17/2020   CL 106 06/17/2020   CO2 24 06/17/2020   Lab Results  Component Value Date   CHOL 267 (H) 12/26/2019   HDL 71 12/26/2019   LDLCALC 173 (H) 12/26/2019   TRIG 128 12/26/2019   CHOLHDL 3.8 12/26/2019   Lab Results  Component Value Date   TSH 2.060 12/26/2019   Lab Results  Component Value Date   HGBA1C 6.1 (H) 06/17/2020   Lab Results  Component Value Date   WBC 7.8 12/26/2019   HGB 13.3 12/26/2019   HCT 41.0 12/26/2019   MCV 86 12/26/2019   PLT 216 12/26/2019   Lab Results  Component Value Date   ALT 13 12/26/2019    AST 16 12/26/2019   ALKPHOS 112 12/26/2019   BILITOT 0.9 12/26/2019     Review of Systems  Constitutional:  Negative for chills, fatigue and fever.  HENT:  Negative for congestion, hearing loss, tinnitus, trouble swallowing and voice change.   Eyes:  Negative for visual disturbance.  Respiratory:  Negative for cough, chest tightness, shortness of breath and wheezing.   Cardiovascular:  Negative for chest pain, palpitations and leg swelling.  Gastrointestinal:  Negative for abdominal pain, constipation, diarrhea and vomiting.  Endocrine: Negative for polydipsia and polyuria.  Genitourinary:  Negative for dysuria, frequency, genital sores, vaginal bleeding and vaginal discharge.  Musculoskeletal:  Negative for arthralgias, gait problem and joint swelling.  Skin:  Negative for color change and rash.  Neurological:  Negative for dizziness, tremors, light-headedness and headaches.  Hematological:  Negative for adenopathy. Does not bruise/bleed easily.  Psychiatric/Behavioral:  Negative for dysphoric mood and sleep disturbance. The patient is not nervous/anxious.    Patient Active Problem List   Diagnosis Date Noted   Rhinosinusitis 09/29/2020   Spondylosis of lumbar region without myelopathy or radiculopathy 12/04/2019   Vertigo 10/01/2016   Ankle edema 04/11/2015   Plantar fasciitis 12/31/2014   Aortic atherosclerosis ([place]) 12/31/2014   Allergic state 12/31/2014   Essential (primary) hypertension 12/31/2014   Arthralgia of hip 12/31/2014   Prediabetes 12/31/2014   Migraine without aura and responsive to treatment 12/31/2014   Arthritis of knee, degenerative 12/31/2014   Periodic limb movement 12/31/2014   Apnea, sleep 12/31/2014  No Known Allergies  Past Surgical History:  Procedure Laterality Date   CATARACT EXTRACTION Left 2016   ENDOMETRIAL BIOPSY  2005   benign   KNEE ARTHROSCOPY Right 2009   TEMPORAL ARTERY BIOPSY / LIGATION  05/2013   negative    Social History    Tobacco Use   Smoking status: Never   Smokeless tobacco: Never  Vaping Use   Vaping Use: Never used  Substance Use Topics   Alcohol use: No    Alcohol/week: 0.0 standard drinks     Medication list has been reviewed and updated.  Current Meds  Medication Sig   acetaminophen (TYLENOL) 500 MG tablet Take 1,000 mg by mouth every 6 (six) hours as needed for moderate pain.   dextromethorphan-guaiFENesin (MUCINEX DM) 30-600 MG 12hr tablet Take 1 tablet by mouth 2 (two) times daily.   fexofenadine (ALLEGRA ALLERGY) 180 MG tablet Take 1 tablet (180 mg total) by mouth daily.   fluticasone (FLONASE) 50 MCG/ACT nasal spray Place 2 sprays into both nostrils daily. (Patient taking differently: Place 2 sprays into both nostrils as needed.)   gabapentin (NEURONTIN) 100 MG capsule Take 1-3 capsules (100-300 mg total) by mouth at bedtime.   hydrochlorothiazide (HYDRODIURIL) 25 MG tablet Take 1 tablet (25 mg total) by mouth daily.   ibuprofen (ADVIL) 600 MG tablet Take 1 tablet (600 mg total) by mouth every 8 (eight) hours as needed.   pramipexole (MIRAPEX) 0.25 MG tablet Take 1 tablet by mouth at bedtime.    PHQ 2/9 Scores 12/31/2020 10/13/2020 09/29/2020 06/17/2020  PHQ - 2 Score 0 0 2 2  PHQ- 9 Score 10 2 11 6     GAD 7 : Generalized Anxiety Score 12/31/2020 10/13/2020 09/29/2020 06/17/2020  Nervous, Anxious, on Edge 0 1 0 1  Control/stop worrying 0 0 0 1  Worry too much - different things 0 0 0 1  Trouble relaxing 3 0 1 2  Restless 3 0 1 0  Easily annoyed or irritable 0 0 1 0  Afraid - awful might happen 0 0 1 0  Total GAD 7 Score 6 1 4 5   Anxiety Difficulty Not difficult at all - Not difficult at all Not difficult at all    BP Readings from Last 3 Encounters:  12/31/20 122/68  09/29/20 (!) 154/76  06/17/20 128/70    Physical Exam Vitals and nursing note reviewed.  Constitutional:      General: She is not in acute distress.    Appearance: She is well-developed.  HENT:     Head:  Normocephalic and atraumatic.     Right Ear: Tympanic membrane and ear canal normal.     Left Ear: Tympanic membrane and ear canal normal.     Nose:     Right Sinus: No maxillary sinus tenderness.     Left Sinus: No maxillary sinus tenderness.  Eyes:     General: No scleral icterus.       Right eye: No discharge.        Left eye: No discharge.     Conjunctiva/sclera: Conjunctivae normal.  Neck:     Thyroid: No thyromegaly.     Vascular: No carotid bruit.  Cardiovascular:     Rate and Rhythm: Normal rate and regular rhythm.     Pulses: Normal pulses.     Heart sounds: Normal heart sounds.  Pulmonary:     Effort: Pulmonary effort is normal. No respiratory distress.     Breath sounds: No wheezing.  Chest:  Breasts:    Right: No mass, nipple discharge, skin change or tenderness.     Left: No mass, nipple discharge, skin change or tenderness.  Abdominal:     General: Bowel sounds are normal.     Palpations: Abdomen is soft.     Tenderness: There is no abdominal tenderness.  Genitourinary:    Labia:        Right: No tenderness, lesion or injury.        Left: No tenderness, lesion or injury.      Vagina: Normal.     Cervix: Normal.     Uterus: Normal.      Adnexa: Right adnexa normal and left adnexa normal.     Comments: Pap obtained Musculoskeletal:     Cervical back: Normal range of motion. No erythema.     Right lower leg: No edema.     Left lower leg: No edema.  Lymphadenopathy:     Cervical: No cervical adenopathy.  Skin:    General: Skin is warm and dry.     Findings: No rash.  Neurological:     Mental Status: She is alert and oriented to person, place, and time.     Cranial Nerves: No cranial nerve deficit.     Sensory: No sensory deficit.     Deep Tendon Reflexes: Reflexes are normal and symmetric.  Psychiatric:        Attention and Perception: Attention normal.        Mood and Affect: Mood normal.    Wt Readings from Last 3 Encounters:  12/31/20 226 lb 3.2  oz (102.6 kg)  09/29/20 217 lb (98.4 kg)  06/17/20 221 lb (100.2 kg)    BP 122/68   Pulse 87   Ht 5\' 4"  (1.626 m)   Wt 226 lb 3.2 oz (102.6 kg)   SpO2 97%   BMI 38.83 kg/m   Assessment and Plan: 1. Annual physical exam Exam is normal except for weight. Encourage regular exercise and appropriate dietary changes.  Chronic foot pain prevents regular exercise  2. Colon cancer screening Referred last year but pt never responded. - Ambulatory referral to Gastroenterology  3. Encounter for screening for cervical cancer Pap obtained - Cytology - PAP  4. Essential (primary) hypertension Clinically stable exam with well controlled BP. Tolerating medications without side effects at this time. Pt to continue current regimen and low sodium diet; benefits of regular exercise as able discussed. - CBC with Differential/Platelet - Comprehensive metabolic panel - TSH - POCT urinalysis dipstick  5. Aortic atherosclerosis ([place]) Will check labs and advise - Lipid panel  6. Prediabetes Continue dietary changes - Hemoglobin A1c   Partially dictated using Editor, commissioning. Any errors are unintentional.  Halina Maidens, MD [facility]  12/31/2020

## 2021-01-01 LAB — CYTOLOGY - PAP
Comment: NEGATIVE
Diagnosis: NEGATIVE
High risk HPV: NEGATIVE

## 2021-01-01 LAB — CBC WITH DIFFERENTIAL/PLATELET
Basophils Absolute: 0 10*3/uL (ref 0.0–0.2)
Basos: 1 %
EOS (ABSOLUTE): 0.3 10*3/uL (ref 0.0–0.4)
Eos: 4 %
Hematocrit: 38.5 % (ref 34.0–46.6)
Hemoglobin: 11.9 g/dL (ref 11.1–15.9)
Immature Grans (Abs): 0 10*3/uL (ref 0.0–0.1)
Immature Granulocytes: 0 %
Lymphocytes Absolute: 2.3 10*3/uL (ref 0.7–3.1)
Lymphs: 31 %
MCH: 27 pg (ref 26.6–33.0)
MCHC: 30.9 g/dL — ABNORMAL LOW (ref 31.5–35.7)
MCV: 88 fL (ref 79–97)
Monocytes Absolute: 0.6 10*3/uL (ref 0.1–0.9)
Monocytes: 8 %
Neutrophils Absolute: 4.2 10*3/uL (ref 1.4–7.0)
Neutrophils: 56 %
Platelets: 247 10*3/uL (ref 150–450)
RBC: 4.4 x10E6/uL (ref 3.77–5.28)
RDW: 13.6 % (ref 11.7–15.4)
WBC: 7.4 10*3/uL (ref 3.4–10.8)

## 2021-01-01 LAB — LIPID PANEL
Chol/HDL Ratio: 3.5 ratio (ref 0.0–4.4)
Cholesterol, Total: 227 mg/dL — ABNORMAL HIGH (ref 100–199)
HDL: 65 mg/dL (ref >39)
LDL Chol Calc (NIH): 143 mg/dL — ABNORMAL HIGH (ref 0–99)
Triglycerides: 107 mg/dL (ref 0–149)
VLDL Cholesterol Cal: 19 mg/dL (ref 5–40)

## 2021-01-01 LAB — HEMOGLOBIN A1C
Est. average glucose Bld gHb Est-mCnc: 134 mg/dL
Hgb A1c MFr Bld: 6.3 % — ABNORMAL HIGH (ref 4.8–5.6)

## 2021-01-01 LAB — COMPREHENSIVE METABOLIC PANEL
ALT: 9 IU/L (ref 0–32)
AST: 11 IU/L (ref 0–40)
Albumin/Globulin Ratio: 1.8 (ref 1.2–2.2)
Albumin: 4.5 g/dL (ref 3.8–4.8)
Alkaline Phosphatase: 110 IU/L (ref 44–121)
BUN/Creatinine Ratio: 9 — ABNORMAL LOW (ref 12–28)
BUN: 8 mg/dL (ref 8–27)
Bilirubin Total: 0.6 mg/dL (ref 0.0–1.2)
CO2: 22 mmol/L (ref 20–29)
Calcium: 10.4 mg/dL — ABNORMAL HIGH (ref 8.7–10.3)
Chloride: 106 mmol/L (ref 96–106)
Creatinine, Ser: 0.92 mg/dL (ref 0.57–1.00)
Globulin, Total: 2.5 g/dL (ref 1.5–4.5)
Glucose: 66 mg/dL — ABNORMAL LOW (ref 70–99)
Potassium: 4 mmol/L (ref 3.5–5.2)
Sodium: 143 mmol/L (ref 134–144)
Total Protein: 7 g/dL (ref 6.0–8.5)
eGFR: 70 mL/min/{1.73_m2} (ref >59)

## 2021-01-01 LAB — TSH: TSH: 2.14 u[IU]/mL (ref 0.450–4.500)

## 2021-06-02 ENCOUNTER — Other Ambulatory Visit: Payer: Self-pay | Admitting: Internal Medicine

## 2021-06-02 DIAGNOSIS — I10 Essential (primary) hypertension: Secondary | ICD-10-CM

## 2021-06-04 NOTE — Telephone Encounter (Signed)
30 day courtesy supply given until appt in April ? ?Requested Prescriptions  ?Pending Prescriptions Disp Refills  ?? hydrochlorothiazide (HYDRODIURIL) 25 MG tablet [Pharmacy Med Name: hydroCHLOROthiazide 25 MG Oral Tablet] 90 tablet 0  ?  Sig: Take 1 tablet by mouth once daily  ?  ? Cardiovascular: Diuretics - Thiazide Passed - 06/02/2021  6:22 PM  ?  ?  Passed - Cr in normal range and within 180 days  ?  Creatinine, Ser  ?Date Value Ref Range Status  ?12/31/2020 0.92 0.57 - 1.00 mg/dL Final  ?   ?  ?  Passed - K in normal range and within 180 days  ?  Potassium  ?Date Value Ref Range Status  ?12/31/2020 4.0 3.5 - 5.2 mmol/L Final  ?   ?  ?  Passed - Na in normal range and within 180 days  ?  Sodium  ?Date Value Ref Range Status  ?12/31/2020 143 134 - 144 mmol/L Final  ?   ?  ?  Passed - Last BP in normal range  ?  BP Readings from Last 1 Encounters:  ?12/31/20 122/68  ?   ?  ?  Passed - Valid encounter within last 6 months  ?  Recent Outpatient Visits   ?      ? 5 months ago Annual physical exam  ? [facility] Glean Hess, MD  ? 7 months ago COVID-19  ? [facility] Glean Hess, MD  ? 8 months ago Rhinosinusitis  ? [facility] Montel Culver, MD  ? 11 months ago Essential hypertension  ? [facility] Glean Hess, MD  ? 1 year ago Annual physical exam  ? [facility] Glean Hess, MD  ?  ?  ?Future Appointments   ?        ? In 3 weeks Army Melia Jesse Sans, MD [facility], [place]  ? In 7 months Army Melia Jesse Sans, MD [facility], [place]  ?  ? ?  ?  ?  ? ?

## 2021-07-01 ENCOUNTER — Ambulatory Visit (INDEPENDENT_AMBULATORY_CARE_PROVIDER_SITE_OTHER): Payer: BC Managed Care – PPO | Admitting: Internal Medicine

## 2021-07-01 ENCOUNTER — Encounter: Payer: Self-pay | Admitting: Internal Medicine

## 2021-07-01 VITALS — BP 128/80 | HR 68 | Ht 64.0 in | Wt 213.8 lb

## 2021-07-01 DIAGNOSIS — Z1231 Encounter for screening mammogram for malignant neoplasm of breast: Secondary | ICD-10-CM | POA: Diagnosis not present

## 2021-07-01 DIAGNOSIS — I700 Atherosclerosis of aorta: Secondary | ICD-10-CM | POA: Diagnosis not present

## 2021-07-01 DIAGNOSIS — R7303 Prediabetes: Secondary | ICD-10-CM

## 2021-07-01 DIAGNOSIS — I10 Essential (primary) hypertension: Secondary | ICD-10-CM | POA: Diagnosis not present

## 2021-07-01 MED ORDER — HYDROCHLOROTHIAZIDE 25 MG PO TABS: 25.0000 mg | ORAL_TABLET | Freq: Every day | ORAL | 1 refills | Status: DC

## 2021-07-01 NOTE — Progress Notes (Signed)
? ? ?Date:  07/01/2021  ? ?Name:  Alicia Andersen   DOB:  07/10/1955   MRN:  [id number] ? ? ?Chief Complaint: Diabetes and Hypertension ? ?Hypertension ?This is a chronic problem. The problem is controlled. Pertinent negatives include no chest pain, headaches, palpitations or shortness of breath. Past treatments include diuretics.  ?Diabetes ?She presents for her follow-up diabetic visit. Diabetes type: prediabetes. Pertinent negatives for hypoglycemia include no dizziness or headaches. Pertinent negatives for diabetes include no chest pain, no fatigue and no weakness. When asked about current treatments, none (we discussed diet and need to limit sweets, esp ice cream) were reported.  ? ?Lab Results  ?Component Value Date  ? NA 143 12/31/2020  ? K 4.0 12/31/2020  ? CO2 22 12/31/2020  ? GLUCOSE 66 (L) 12/31/2020  ? BUN 8 12/31/2020  ? CREATININE 0.92 12/31/2020  ? CALCIUM 10.4 (H) 12/31/2020  ? EGFR 70 12/31/2020  ? GFRNONAA 71 12/26/2019  ? ?Lab Results  ?Component Value Date  ? CHOL 227 (H) 12/31/2020  ? HDL 65 12/31/2020  ? LDLCALC 143 (H) 12/31/2020  ? TRIG 107 12/31/2020  ? CHOLHDL 3.5 12/31/2020  ? ?Lab Results  ?Component Value Date  ? TSH 2.140 12/31/2020  ? ?Lab Results  ?Component Value Date  ? HGBA1C 6.3 (H) 12/31/2020  ? ?Lab Results  ?Component Value Date  ? WBC 7.4 12/31/2020  ? HGB 11.9 12/31/2020  ? HCT 38.5 12/31/2020  ? MCV 88 12/31/2020  ? PLT 247 12/31/2020  ? ?Lab Results  ?Component Value Date  ? ALT 9 12/31/2020  ? AST 11 12/31/2020  ? ALKPHOS 110 12/31/2020  ? BILITOT 0.6 12/31/2020  ? ?No results found for: 25OHVITD2, [place], VD25OH  ? ?Review of Systems  ?Constitutional:  Negative for fatigue and unexpected weight change.  ?HENT:  Negative for nosebleeds.   ?Eyes:  Negative for visual disturbance.  ?Respiratory:  Negative for cough, chest tightness, shortness of breath and wheezing.   ?Cardiovascular:  Negative for chest pain, palpitations and leg swelling.  ?Gastrointestinal:  Negative for  abdominal pain, constipation and diarrhea.  ?Allergic/Immunologic: Positive for environmental allergies.  ?Neurological:  Negative for dizziness, weakness, light-headedness and headaches.  ? ?Patient Active Problem List  ? Diagnosis Date Noted  ? Rhinosinusitis 09/29/2020  ? Spondylosis of lumbar region without myelopathy or radiculopathy 12/04/2019  ? Vertigo 10/01/2016  ? Ankle edema 04/11/2015  ? Plantar fasciitis 12/31/2014  ? Aortic atherosclerosis ([place]) 12/31/2014  ? Allergic state 12/31/2014  ? Essential (primary) hypertension 12/31/2014  ? Arthralgia of hip 12/31/2014  ? Prediabetes 12/31/2014  ? Migraine without aura and responsive to treatment 12/31/2014  ? Arthritis of knee, degenerative 12/31/2014  ? Periodic limb movement 12/31/2014  ? Apnea, sleep 12/31/2014  ? ? ?No Known Allergies ? ?Past Surgical History:  ?Procedure Laterality Date  ? CATARACT EXTRACTION Left 2016  ? ENDOMETRIAL BIOPSY  2005  ? benign  ? KNEE ARTHROSCOPY Right 2009  ? TEMPORAL ARTERY BIOPSY / LIGATION  05/2013  ? negative  ? ? ?Social History  ? ?Tobacco Use  ? Smoking status: Never  ? Smokeless tobacco: Never  ?Vaping Use  ? Vaping Use: Never used  ?Substance Use Topics  ? Alcohol use: No  ?  Alcohol/week: 0.0 standard drinks  ? ? ? ?Medication list has been reviewed and updated. ? ?Current Meds  ?Medication Sig  ? acetaminophen (TYLENOL) 500 MG tablet Take 1,000 mg by mouth every 6 (six) hours as needed for moderate pain.  ?  dextromethorphan-guaiFENesin (MUCINEX DM) 30-600 MG 12hr tablet Take 1 tablet by mouth 2 (two) times daily.  ? fexofenadine (ALLEGRA ALLERGY) 180 MG tablet Take 1 tablet (180 mg total) by mouth daily.  ? fluticasone (FLONASE) 50 MCG/ACT nasal spray Place 2 sprays into both nostrils daily. (Patient taking differently: Place 2 sprays into both nostrils as needed.)  ? gabapentin (NEURONTIN) 100 MG capsule Take 1-3 capsules (100-300 mg total) by mouth at bedtime.  ? ibuprofen (ADVIL) 600 MG tablet Take 1 tablet  (600 mg total) by mouth every 8 (eight) hours as needed.  ? pramipexole (MIRAPEX) 0.25 MG tablet Take 1 tablet by mouth at bedtime.  ? [DISCONTINUED] hydrochlorothiazide (HYDRODIURIL) 25 MG tablet Take 1 tablet by mouth once daily  ? ? ? ?  07/01/2021  ?  8:17 AM 12/31/2020  ?  8:17 AM 10/13/2020  ? 11:03 AM 09/29/2020  ?  1:44 PM  ?GAD 7 : Generalized Anxiety Score  ?Nervous, Anxious, on Edge 1 0 1 0  ?Control/stop worrying 1 0 0 0  ?Worry too much - different things 1 0 0 0  ?Trouble relaxing 1 3 0 1  ?Restless 1 3 0 1  ?Easily annoyed or irritable 0 0 0 1  ?Afraid - awful might happen 0 0 0 1  ?Total GAD 7 Score _0 ?Anxiety Difficulty Not difficult at all Not difficult at all  Not difficult at all  ? ? ? ?  07/01/2021  ?  8:17 AM  ?Depression screen PHQ 2/9  ?Decreased Interest 0  ?Down, Depressed, Hopeless 0  ?PHQ - 2 Score 0  ?Altered sleeping 0  ?Tired, decreased energy 0  ?Change in appetite 0  ?Feeling bad or failure about yourself  0  ?Trouble concentrating 2  ?Moving slowly or fidgety/restless 0  ?Suicidal thoughts 0  ?PHQ-9 Score 2  ?Difficult doing work/chores Not difficult at all  ? ? ?BP Readings from Last 3 Encounters:  ?07/01/21 128/80  ?12/31/20 122/68  ?09/29/20 (!) 154/76  ? ? ?Physical Exam ?Vitals and nursing note reviewed.  ?Constitutional:   ?   General: She is not in acute distress. ?   Appearance: Normal appearance. She is well-developed.  ?HENT:  ?   Head: Normocephalic and atraumatic.  ?Cardiovascular:  ?   Rate and Rhythm: Normal rate and regular rhythm.  ?   Pulses: Normal pulses.  ?   Heart sounds: No murmur heard. ?Pulmonary:  ?   Effort: Pulmonary effort is normal. No respiratory distress.  ?   Breath sounds: No wheezing or rhonchi.  ?Musculoskeletal:  ?   Cervical back: Normal range of motion.  ?   Right lower leg: No edema.  ?   Left lower leg: No edema.  ?Lymphadenopathy:  ?   Cervical: No cervical adenopathy.  ?Skin: ?   General: Skin is warm and dry.  ?   Capillary Refill:  Capillary refill takes less than 2 seconds.  ?   Findings: No rash.  ?Neurological:  ?   General: No focal deficit present.  ?   Mental Status: She is alert and oriented to person, place, and time.  ?Psychiatric:     ?   Mood and Affect: Mood normal.     ?   Behavior: Behavior normal.  ? ? ?Wt Readings from Last 3 Encounters:  ?07/01/21 213 lb 12.8 oz (97 kg)  ?12/31/20 226 lb 3.2 oz (102.6 kg)  ?09/29/20 217 lb (98.4 kg)  ? ? ?  BP 128/80   Pulse 68   Ht 5' 4" (1.626 m)   Wt 213 lb 12.8 oz (97 kg)   BMI 36.70 kg/m?  ? ?Assessment and Plan: ?1. Essential (primary) hypertension ?Clinically stable exam with well controlled BP. ?Tolerating medications without side effects at this time. ?Pt to continue current regimen and low sodium diet; benefits of regular exercise as able discussed. ?- Basic metabolic panel ?- hydrochlorothiazide (HYDRODIURIL) 25 MG tablet; Take 1 tablet (25 mg total) by mouth daily.  Dispense: 90 tablet; Refill: 1 ? ?2. Prediabetes ?Discussed need for diet changes to avoid progression to DM ?Will check labs and advise. ?- Hemoglobin A1c ?- Microalbumin / creatinine urine ratio ? ?3. Aortic atherosclerosis ([place]) ?Recommend low fat diet ? ?4. Encounter for screening mammogram for breast cancer ?Schedule at [facility] ? ? ?Partially dictated using Editor, commissioning. Any errors are unintentional. ? ?Halina Maidens, MD ?[facility] ?[facility] ? ?07/01/2021 ? ? ? ? ? ? ?

## 2021-07-02 LAB — MICROALBUMIN / CREATININE URINE RATIO
Creatinine, Urine: 223.7 mg/dL
Microalb/Creat Ratio: 4 mg/g creat (ref 0–29)
Microalbumin, Urine: 8.8 ug/mL

## 2021-07-02 LAB — BASIC METABOLIC PANEL
BUN/Creatinine Ratio: 18 (ref 12–28)
BUN: 13 mg/dL (ref 8–27)
CO2: 25 mmol/L (ref 20–29)
Calcium: 10.4 mg/dL — ABNORMAL HIGH (ref 8.7–10.3)
Chloride: 104 mmol/L (ref 96–106)
Creatinine, Ser: 0.73 mg/dL (ref 0.57–1.00)
Glucose: 106 mg/dL — ABNORMAL HIGH (ref 70–99)
Potassium: 4.1 mmol/L (ref 3.5–5.2)
Sodium: 143 mmol/L (ref 134–144)
eGFR: 92 mL/min/{1.73_m2} (ref >59)

## 2021-07-02 LAB — HEMOGLOBIN A1C
Est. average glucose Bld gHb Est-mCnc: 134 mg/dL
Hgb A1c MFr Bld: 6.3 % — ABNORMAL HIGH (ref 4.8–5.6)

## 2021-09-09 LAB — HM MAMMOGRAPHY

## 2021-10-14 ENCOUNTER — Other Ambulatory Visit: Payer: Self-pay | Admitting: Internal Medicine

## 2021-10-14 DIAGNOSIS — M545 Low back pain, unspecified: Secondary | ICD-10-CM

## 2021-10-15 NOTE — Telephone Encounter (Signed)
Requested medication (s) are due for refill today: Unsure, rx 66 years old  Requested medication (s) are on the active medication list: yes  Last refill:  11/20/2019 #90 with 5 RF  Future visit scheduled: 01/05/22, seen 07/01/21  Notes to clinic:  This rx was last written almost two years ago, unsure if to be renewed, please assess.      Requested Prescriptions  Pending Prescriptions Disp Refills   ibuprofen (ADVIL) 600 MG tablet [Pharmacy Med Name: Ibuprofen 600 MG Oral Tablet] 90 tablet 0    Sig: TAKE 1 TABLET BY MOUTH EVERY 8 HOURS AS NEEDED     Analgesics:  NSAIDS Failed - 10/14/2021  1:43 PM      Failed - Manual Review: Labs are only required if the patient has taken medication for more than 8 weeks.      Passed - Cr in normal range and within 360 days    Creatinine, Ser  Date Value Ref Range Status  07/01/2021 0.73 0.57 - 1.00 mg/dL Final         Passed - HGB in normal range and within 360 days    Hemoglobin  Date Value Ref Range Status  12/31/2020 11.9 11.1 - 15.9 g/dL Final         Passed - PLT in normal range and within 360 days    Platelets  Date Value Ref Range Status  12/31/2020 247 150 - 450 x10E3/uL Final         Passed - HCT in normal range and within 360 days    Hematocrit  Date Value Ref Range Status  12/31/2020 38.5 34.0 - 46.6 % Final         Passed - eGFR is 30 or above and within 360 days    GFR calc Af Amer  Date Value Ref Range Status  12/26/2019 81 >59 mL/min/1.73 Final    Comment:    **In accordance with recommendations from the NKF-ASN Task force,**   [facility] is in the process of updating its eGFR calculation to the   2021 CKD-EPI creatinine equation that estimates kidney function   without a race variable.    GFR calc non Af Amer  Date Value Ref Range Status  12/26/2019 71 >59 mL/min/1.73 Final   eGFR  Date Value Ref Range Status  07/01/2021 92 >59 mL/min/1.73 Final         Passed - Patient is not pregnant      Passed - Valid  encounter within last 12 months    Recent Outpatient Visits           3 months ago Essential (primary) hypertension   [facility] Glean Hess, MD   9 months ago Annual physical exam   [facility] Glean Hess, MD   1 year ago COVID-19   [facility] Glean Hess, MD   1 year ago [facility] Montel Culver, MD   1 year ago Essential hypertension   [facility] Glean Hess, MD       Future Appointments             In 2 months Army Melia Jesse Sans, MD [facility], [facility]

## 2022-01-05 ENCOUNTER — Encounter: Payer: Self-pay | Admitting: Internal Medicine

## 2022-01-05 ENCOUNTER — Ambulatory Visit (INDEPENDENT_AMBULATORY_CARE_PROVIDER_SITE_OTHER): Payer: BC Managed Care – PPO | Admitting: Internal Medicine

## 2022-01-05 VITALS — BP 128/76 | HR 76 | Ht 64.0 in | Wt 216.0 lb

## 2022-01-05 DIAGNOSIS — R7303 Prediabetes: Secondary | ICD-10-CM | POA: Diagnosis not present

## 2022-01-05 DIAGNOSIS — N3281 Overactive bladder: Secondary | ICD-10-CM

## 2022-01-05 DIAGNOSIS — Z23 Encounter for immunization: Secondary | ICD-10-CM

## 2022-01-05 DIAGNOSIS — E785 Hyperlipidemia, unspecified: Secondary | ICD-10-CM | POA: Diagnosis not present

## 2022-01-05 DIAGNOSIS — Z1211 Encounter for screening for malignant neoplasm of colon: Secondary | ICD-10-CM

## 2022-01-05 DIAGNOSIS — Z0000 Encounter for general adult medical examination without abnormal findings: Secondary | ICD-10-CM

## 2022-01-05 DIAGNOSIS — G43009 Migraine without aura, not intractable, without status migrainosus: Secondary | ICD-10-CM

## 2022-01-05 DIAGNOSIS — I10 Essential (primary) hypertension: Secondary | ICD-10-CM

## 2022-01-05 DIAGNOSIS — E782 Mixed hyperlipidemia: Secondary | ICD-10-CM | POA: Insufficient documentation

## 2022-01-05 LAB — POCT URINALYSIS DIPSTICK
Bilirubin, UA: NEGATIVE
Blood, UA: NEGATIVE
Glucose, UA: NEGATIVE
Ketones, UA: NEGATIVE
Leukocytes, UA: NEGATIVE
Nitrite, UA: NEGATIVE
Protein, UA: NEGATIVE
Spec Grav, UA: 1.015 (ref 1.010–1.025)
Urobilinogen, UA: 0.2 E.U./dL
pH, UA: 7.5 (ref 5.0–8.0)

## 2022-01-05 MED ORDER — HYDROCHLOROTHIAZIDE 25 MG PO TABS: 25.0000 mg | ORAL_TABLET | Freq: Every day | ORAL | 1 refills | Status: DC

## 2022-01-05 MED ORDER — OXYBUTYNIN CHLORIDE ER 5 MG PO TB24: 5.0000 mg | ORAL_TABLET | Freq: Every day | ORAL | 0 refills | Status: DC

## 2022-01-05 MED ORDER — FEXOFENADINE HCL 180 MG PO TABS: 180.0000 mg | ORAL_TABLET | Freq: Every day | ORAL | 3 refills | Status: AC

## 2022-01-05 NOTE — Progress Notes (Signed)
Date:  01/05/2022   Name:  Alicia Andersen   DOB:  04-05-1955   MRN:  [id number]   Chief Complaint: Annual Exam (Breast exam no pap) Alicia Andersen is a 66 y.o. female who presents today for her Complete Annual Exam. She feels fairly well. She reports exercising none. She reports she is sleeping fairly well. Breast complaints none.  Mammogram: 08/2021 DEXA: none Pap smear: 12/2020 Colonoscopy: none  Health Maintenance Due  Topic Date Due   TETANUS/TDAP  Never done   Zoster Vaccines- Shingrix (1 of 2) Never done   COVID-19 Vaccine (4 - Moderna series) 03/28/2020   INFLUENZA VACCINE  10/13/2021    Immunization History  Administered Date(s) Administered   Influenza,inj,Quad PF,6+ Mos 12/22/2018, 12/26/2019, 12/31/2020   Influenza-Unspecified 12/27/2014, 12/27/2017   Moderna Sars-Covid-2 Vaccination 05/23/2019, 06/22/2019, 02/01/2020    Hypertension This is a chronic problem. Associated symptoms include headaches. Pertinent negatives include no chest pain, palpitations or shortness of breath. Past treatments include diuretics. There is no history of kidney disease, CAD/MI or CVA.  Hyperlipidemia This is a chronic problem. The problem is uncontrolled. Recent lipid tests were reviewed and are high. Associated symptoms include myalgias (restless less). Pertinent negatives include no chest pain or shortness of breath.  Diabetes She presents for her follow-up diabetic visit. Diabetes type: prediabetes. Hypoglycemia symptoms include headaches. Pertinent negatives for hypoglycemia include no dizziness, nervousness/anxiousness or tremors. Pertinent negatives for diabetes include no chest pain, no fatigue, no polydipsia and no polyuria. Pertinent negatives for diabetic complications include no CVA. Current diabetic treatment includes diet.    Lab Results  Component Value Date   NA 143 07/01/2021   K 4.1 07/01/2021   CO2 25 07/01/2021   GLUCOSE 106 (H) 07/01/2021   BUN 13 07/01/2021    CREATININE 0.73 07/01/2021   CALCIUM 10.4 (H) 07/01/2021   EGFR 92 07/01/2021   GFRNONAA 71 12/26/2019   Lab Results  Component Value Date   CHOL 227 (H) 12/31/2020   HDL 65 12/31/2020   LDLCALC 143 (H) 12/31/2020   TRIG 107 12/31/2020   CHOLHDL 3.5 12/31/2020   Lab Results  Component Value Date   TSH 2.140 12/31/2020   Lab Results  Component Value Date   HGBA1C 6.3 (H) 07/01/2021   Lab Results  Component Value Date   WBC 7.4 12/31/2020   HGB 11.9 12/31/2020   HCT 38.5 12/31/2020   MCV 88 12/31/2020   PLT 247 12/31/2020   Lab Results  Component Value Date   ALT 9 12/31/2020   AST 11 12/31/2020   ALKPHOS 110 12/31/2020   BILITOT 0.6 12/31/2020   No results found for: "25OHVITD2", "25OHVITD3", "VD25OH"   Review of Systems  Constitutional:  Negative for chills, fatigue and fever.  HENT:  Negative for congestion, hearing loss, tinnitus, trouble swallowing and voice change.   Eyes:  Negative for visual disturbance.  Respiratory:  Negative for cough, chest tightness, shortness of breath and wheezing.   Cardiovascular:  Positive for leg swelling (swelling around the right lower ankle). Negative for chest pain and palpitations.  Gastrointestinal:  Negative for abdominal pain, constipation, diarrhea and vomiting.  Endocrine: Negative for polydipsia and polyuria.  Genitourinary:  Negative for dysuria, frequency, genital sores, vaginal bleeding and vaginal discharge.  Musculoskeletal:  Positive for myalgias (restless less). Negative for arthralgias, gait problem and joint swelling.  Skin:  Negative for color change and rash.  Neurological:  Positive for numbness (fingertips turn white and feel numb/painful) and headaches. Negative  for dizziness, tremors and light-headedness.  Hematological:  Negative for adenopathy. Does not bruise/bleed easily.  Psychiatric/Behavioral:  Negative for dysphoric mood and sleep disturbance. The patient is not nervous/anxious.     Patient  Active Problem List   Diagnosis Date Noted   Mild hyperlipidemia 01/05/2022   Rhinosinusitis 09/29/2020   Spondylosis of lumbar region without myelopathy or radiculopathy 12/04/2019   Vertigo 10/01/2016   Ankle edema 04/11/2015   Plantar fasciitis 12/31/2014   Aortic atherosclerosis ([place]) 12/31/2014   Allergic state 12/31/2014   Essential (primary) hypertension 12/31/2014   Arthralgia of hip 12/31/2014   Prediabetes 12/31/2014   Migraine without aura and responsive to treatment 12/31/2014   Arthritis of knee, degenerative 12/31/2014   Periodic limb movement 12/31/2014   Apnea, sleep 12/31/2014    No Known Allergies  Past Surgical History:  Procedure Laterality Date   CATARACT EXTRACTION Left 2016   ENDOMETRIAL BIOPSY  2005   benign   KNEE ARTHROSCOPY Right 2009   TEMPORAL ARTERY BIOPSY / LIGATION  05/2013   negative    Social History   Tobacco Use   Smoking status: Never   Smokeless tobacco: Never  Vaping Use   Vaping Use: Never used  Substance Use Topics   Alcohol use: No    Alcohol/week: 0.0 standard drinks of alcohol     Medication list has been reviewed and updated.  Current Meds  Medication Sig   acetaminophen (TYLENOL) 500 MG tablet Take 1,000 mg by mouth every 6 (six) hours as needed for moderate pain.   dextromethorphan-guaiFENesin (MUCINEX DM) 30-600 MG 12hr tablet Take 1 tablet by mouth 2 (two) times daily.   fexofenadine (ALLEGRA ALLERGY) 180 MG tablet Take 1 tablet (180 mg total) by mouth daily.   gabapentin (NEURONTIN) 100 MG capsule Take 1-3 capsules (100-300 mg total) by mouth at bedtime.   hydrochlorothiazide (HYDRODIURIL) 25 MG tablet Take 1 tablet (25 mg total) by mouth daily.   ibuprofen (ADVIL) 600 MG tablet TAKE 1 TABLET BY MOUTH EVERY 8 HOURS AS NEEDED   pramipexole (MIRAPEX) 0.25 MG tablet Take 1 tablet by mouth at bedtime.       01/05/2022    8:04 AM 07/01/2021    8:17 AM 12/31/2020    8:17 AM 10/13/2020   11:03 AM  GAD 7 :  Generalized Anxiety Score  Nervous, Anxious, on Edge 1 1 0 1  Control/stop worrying 1 1 0 0  Worry too much - different things 1 1 0 0  Trouble relaxing _0 0  Restless _1 0  Easily annoyed or irritable 1 0 0 0  Afraid - awful might happen 3 0 0 0  Total GAD 7 Score _2 Anxiety Difficulty Not difficult at all Not difficult at all Not difficult at all        01/05/2022    7:59 AM 07/01/2021    8:17 AM 12/31/2020    8:17 AM  Depression screen PHQ 2/9  Decreased Interest 1 0 0  Down, Depressed, Hopeless 1 0 0  PHQ - 2 Score 2 0 0  Altered sleeping 1 0 3  Tired, decreased energy 1 0 3  Change in appetite 0 0 0  Feeling bad or failure about yourself  1 0 0  Trouble concentrating _3 Moving slowly or fidgety/restless 0 0 1  Suicidal thoughts 0 0 0  PHQ-9 Score _4 Difficult doing work/chores Not difficult at all Not  difficult at all Somewhat difficult    BP Readings from Last 3 Encounters:  01/05/22 128/76  07/01/21 128/80  12/31/20 122/68    Physical Exam Vitals and nursing note reviewed.  Constitutional:      General: She is not in acute distress.    Appearance: Normal appearance. She is well-developed. She is obese.  HENT:     Head: Normocephalic and atraumatic.     Right Ear: Tympanic membrane and ear canal normal.     Left Ear: Tympanic membrane and ear canal normal.     Nose:     Right Sinus: No maxillary sinus tenderness.     Left Sinus: No maxillary sinus tenderness.  Eyes:     General: No scleral icterus.       Right eye: No discharge.        Left eye: No discharge.     Conjunctiva/sclera: Conjunctivae normal.  Neck:     Thyroid: No thyromegaly.     Vascular: No carotid bruit.  Cardiovascular:     Rate and Rhythm: Normal rate and regular rhythm.     Pulses: Normal pulses.     Heart sounds: Normal heart sounds.  Pulmonary:     Effort: Pulmonary effort is normal. No respiratory distress.     Breath sounds: No wheezing.  Chest:   Breasts:    Right: No mass, nipple discharge, skin change or tenderness.     Left: No mass, nipple discharge, skin change or tenderness.  Abdominal:     General: Bowel sounds are normal.     Palpations: Abdomen is soft. There is no mass.     Tenderness: There is no abdominal tenderness. There is no guarding or rebound.     Hernia: No hernia is present.  Musculoskeletal:     Cervical back: Normal range of motion. No erythema.     Right lower leg: No edema.     Left lower leg: No edema.       Legs:     Comments: Localized soft tissue swelling  Lymphadenopathy:     Cervical: No cervical adenopathy.  Skin:    General: Skin is warm and dry.     Findings: No rash.     Comments: Fingertips cool - no cyanosis or infarcts  Neurological:     General: No focal deficit present.     Mental Status: She is alert and oriented to person, place, and time.     Cranial Nerves: No cranial nerve deficit.     Sensory: No sensory deficit.     Deep Tendon Reflexes: Reflexes are normal and symmetric.  Psychiatric:        Attention and Perception: Attention normal.        Mood and Affect: Mood is depressed.        Thought Content: Thought content does not include suicidal ideation. Thought content does not include suicidal plan.     Wt Readings from Last 3 Encounters:  01/05/22 216 lb (98 kg)  07/01/21 213 lb 12.8 oz (97 kg)  12/31/20 226 lb 3.2 oz (102.6 kg)    BP 128/76 (BP Location: Right Arm, Cuff Size: Large)   Pulse 76   Ht 5' 4" (1.626 m)   Wt 216 lb (98 kg)   SpO2 97%   BMI 37.08 kg/m   Assessment and Plan: 1. Annual physical exam Exam is normal except for weight. Encourage regular exercise and appropriate dietary changes. Suspect mild Raynaud's - recommend keeping hands/fingers warm at  all times - CBC with Differential/Platelet - Comprehensive metabolic panel - Hemoglobin A1c - Lipid panel - TSH  2. Colon cancer screening Will refer to [facility] - Ambulatory referral to  Gastroenterology  3. Essential (primary) hypertension Clinically stable exam with well controlled BP. Tolerating medications without side effects at this time. Pt to continue current regimen and low sodium diet; benefits of regular exercise as able discussed. Mild dependent edema controlled with support hose - CBC with Differential/Platelet - Comprehensive metabolic panel - TSH - POCT urinalysis dipstick - hydrochlorothiazide (HYDRODIURIL) 25 MG tablet; Take 1 tablet (25 mg total) by mouth daily.  Dispense: 90 tablet; Refill: 1  4. Prediabetes Check labs  - Hemoglobin A1c  5. Migraine without aura and responsive to treatment Clinically stable without change in character or frequency of migraine headaches Headaches respond well to current therapy with nsaids. Will continue current plan, follow up if worsening.  6. Mild hyperlipidemia Check and advise - Lipid panel  7. OAB (overactive bladder) Trial of Ditropan recommended - oxybutynin (DITROPAN XL) 5 MG 24 hr tablet; Take 1 tablet (5 mg total) by mouth at bedtime.  Dispense: 30 tablet; Refill: 0  8. Need for immunization against influenza - Flu Vaccine QUAD 67moIM (Fluarix, Fluzone & Alfiuria Quad PF)  9. Encounter for immunization -[employer] 2023 Covid-19 Vaccine 13yrand older   Partially dictated using DrEditor, commissioningAny errors are unintentional.  LaHalina MaidensMD [facility]  01/05/2022

## 2022-01-06 LAB — CBC WITH DIFFERENTIAL/PLATELET
Basophils Absolute: 0 10*3/uL (ref 0.0–0.2)
Basos: 1 %
EOS (ABSOLUTE): 0.2 10*3/uL (ref 0.0–0.4)
Eos: 3 %
Hematocrit: 40.7 % (ref 34.0–46.6)
Hemoglobin: 13.1 g/dL (ref 11.1–15.9)
Immature Grans (Abs): 0 10*3/uL (ref 0.0–0.1)
Immature Granulocytes: 0 %
Lymphocytes Absolute: 2.3 10*3/uL (ref 0.7–3.1)
Lymphs: 28 %
MCH: 28 pg (ref 26.6–33.0)
MCHC: 32.2 g/dL (ref 31.5–35.7)
MCV: 87 fL (ref 79–97)
Monocytes Absolute: 0.7 10*3/uL (ref 0.1–0.9)
Monocytes: 9 %
Neutrophils Absolute: 4.9 10*3/uL (ref 1.4–7.0)
Neutrophils: 59 %
Platelets: 247 10*3/uL (ref 150–450)
RBC: 4.68 x10E6/uL (ref 3.77–5.28)
RDW: 13.2 % (ref 11.7–15.4)
WBC: 8.2 10*3/uL (ref 3.4–10.8)

## 2022-01-06 LAB — COMPREHENSIVE METABOLIC PANEL
ALT: 8 IU/L (ref 0–32)
AST: 10 IU/L (ref 0–40)
Albumin/Globulin Ratio: 1.6 (ref 1.2–2.2)
Albumin: 4.4 g/dL (ref 3.9–4.9)
Alkaline Phosphatase: 115 IU/L (ref 44–121)
BUN/Creatinine Ratio: 13 (ref 12–28)
BUN: 11 mg/dL (ref 8–27)
Bilirubin Total: 0.5 mg/dL (ref 0.0–1.2)
CO2: 25 mmol/L (ref 20–29)
Calcium: 10.3 mg/dL (ref 8.7–10.3)
Chloride: 107 mmol/L — ABNORMAL HIGH (ref 96–106)
Creatinine, Ser: 0.82 mg/dL (ref 0.57–1.00)
Globulin, Total: 2.7 g/dL (ref 1.5–4.5)
Glucose: 68 mg/dL — ABNORMAL LOW (ref 70–99)
Potassium: 3.8 mmol/L (ref 3.5–5.2)
Sodium: 145 mmol/L — ABNORMAL HIGH (ref 134–144)
Total Protein: 7.1 g/dL (ref 6.0–8.5)
eGFR: 80 mL/min/{1.73_m2} (ref >59)

## 2022-01-06 LAB — HEMOGLOBIN A1C
Est. average glucose Bld gHb Est-mCnc: 134 mg/dL
Hgb A1c MFr Bld: 6.3 % — ABNORMAL HIGH (ref 4.8–5.6)

## 2022-01-06 LAB — TSH: TSH: 2.16 u[IU]/mL (ref 0.450–4.500)

## 2022-01-06 LAB — LIPID PANEL
Chol/HDL Ratio: 3.5 ratio (ref 0.0–4.4)
Cholesterol, Total: 266 mg/dL — ABNORMAL HIGH (ref 100–199)
HDL: 75 mg/dL (ref >39)
LDL Chol Calc (NIH): 172 mg/dL — ABNORMAL HIGH (ref 0–99)
Triglycerides: 112 mg/dL (ref 0–149)
VLDL Cholesterol Cal: 19 mg/dL (ref 5–40)

## 2022-01-07 NOTE — Progress Notes (Signed)
Pt would like to start statin medication.  KP

## 2022-01-08 ENCOUNTER — Other Ambulatory Visit: Payer: Self-pay | Admitting: Internal Medicine

## 2022-01-08 DIAGNOSIS — E785 Hyperlipidemia, unspecified: Secondary | ICD-10-CM

## 2022-01-08 MED ORDER — ATORVASTATIN CALCIUM 10 MG PO TABS: 10.0000 mg | ORAL_TABLET | Freq: Every day | ORAL | 1 refills | Status: DC

## 2022-07-09 ENCOUNTER — Encounter: Payer: Self-pay | Admitting: Internal Medicine

## 2022-07-09 ENCOUNTER — Ambulatory Visit (INDEPENDENT_AMBULATORY_CARE_PROVIDER_SITE_OTHER): Payer: BC Managed Care – PPO | Admitting: Internal Medicine

## 2022-07-09 VITALS — BP 126/78 | HR 58 | Ht 64.0 in | Wt 223.2 lb

## 2022-07-09 DIAGNOSIS — E785 Hyperlipidemia, unspecified: Secondary | ICD-10-CM

## 2022-07-09 DIAGNOSIS — G4761 Periodic limb movement disorder: Secondary | ICD-10-CM

## 2022-07-09 DIAGNOSIS — R7303 Prediabetes: Secondary | ICD-10-CM

## 2022-07-09 DIAGNOSIS — Z23 Encounter for immunization: Secondary | ICD-10-CM

## 2022-07-09 DIAGNOSIS — I10 Essential (primary) hypertension: Secondary | ICD-10-CM

## 2022-07-09 MED ORDER — HYDROCHLOROTHIAZIDE 25 MG PO TABS: 25.0000 mg | ORAL_TABLET | Freq: Every day | ORAL | 1 refills | Status: DC

## 2022-07-09 MED ORDER — PRAMIPEXOLE DIHYDROCHLORIDE 0.25 MG PO TABS
0.5000 mg | ORAL_TABLET | Freq: Every day | ORAL | 0 refills | Status: DC
Start: 2022-07-09 — End: 2023-01-10

## 2022-07-09 NOTE — Assessment & Plan Note (Signed)
Has been treated by Podiatry On Mirapex 0.25 mg with fair control of symptoms Will increase to 0.5 mg qhs

## 2022-07-09 NOTE — Assessment & Plan Note (Signed)
Cholesterol elevated last visit. Atorvastatin 10 mg started. No side effects noted. Lab Results  Component Value Date   LDLCALC 172 (H) 01/05/2022

## 2022-07-09 NOTE — Assessment & Plan Note (Signed)
Clinically stable exam with well controlled BP on hctz. Tolerating medications without side effects. Pt to continue current regimen and low sodium diet.  

## 2022-07-09 NOTE — Patient Instructions (Signed)
-  It was a pleasure to see you today! Please review your visit summary for helpful information. -Lab results are usually available within 1-2 days and we will call once reviewed. -I would encourage you to follow your care via MyChart where you can access lab results, notes, messages, and more. -If you feel that we did a nice job today, please complete your after-visit survey and leave us a Google review! Your CMA today was Lorali Khamis and your provider was Dr Laura Berglund, MD.  

## 2022-07-09 NOTE — Assessment & Plan Note (Signed)
Diet controlled. Lab Results  Component Value Date   HGBA1C 6.3 (H) 01/05/2022

## 2022-07-09 NOTE — Progress Notes (Signed)
Date:  07/09/2022   Name:  Alicia Andersen   DOB:  03-01-1956   MRN:  [id number]   Chief Complaint: Hypertension  Hypertension This is a chronic problem. The problem is controlled. Associated symptoms include headaches. Pertinent negatives include no chest pain, palpitations or shortness of breath.  Hyperlipidemia This is a chronic problem. Recent lipid tests were reviewed and are high. Pertinent negatives include no chest pain or shortness of breath. Current antihyperlipidemic treatment includes statins (started last visit).  RLS - on low dose Mirapex and still having symptoms. Medications prescribed by Dr. Zelphia Cairo.    Lab Results  Component Value Date   NA 145 (H) 01/05/2022   K 3.8 01/05/2022   CO2 25 01/05/2022   GLUCOSE 68 (L) 01/05/2022   BUN 11 01/05/2022   CREATININE 0.82 01/05/2022   CALCIUM 10.3 01/05/2022   EGFR 80 01/05/2022   GFRNONAA 71 12/26/2019   Lab Results  Component Value Date   CHOL 266 (H) 01/05/2022   HDL 75 01/05/2022   LDLCALC 172 (H) 01/05/2022   TRIG 112 01/05/2022   CHOLHDL 3.5 01/05/2022   Lab Results  Component Value Date   TSH 2.160 01/05/2022   Lab Results  Component Value Date   HGBA1C 6.3 (H) 01/05/2022   Lab Results  Component Value Date   WBC 8.2 01/05/2022   HGB 13.1 01/05/2022   HCT 40.7 01/05/2022   MCV 87 01/05/2022   PLT 247 01/05/2022   Lab Results  Component Value Date   ALT 8 01/05/2022   AST 10 01/05/2022   ALKPHOS 115 01/05/2022   BILITOT 0.5 01/05/2022   No results found for: "25OHVITD2", "25OHVITD3", "VD25OH"   Review of Systems  Constitutional:  Negative for fatigue and unexpected weight change.  HENT:  Negative for nosebleeds.   Eyes:  Negative for visual disturbance.  Respiratory:  Negative for cough, chest tightness, shortness of breath and wheezing.   Cardiovascular:  Negative for chest pain, palpitations and leg swelling.  Gastrointestinal:  Negative for abdominal pain, constipation and diarrhea.   Musculoskeletal:  Positive for arthralgias and gait problem.  Neurological:  Positive for headaches. Negative for dizziness, weakness and light-headedness.  Psychiatric/Behavioral:  Positive for dysphoric mood and sleep disturbance. The patient is not nervous/anxious.     Patient Active Problem List   Diagnosis Date Noted   Mild hyperlipidemia 01/05/2022   Rhinosinusitis 09/29/2020   Spondylosis of lumbar region without myelopathy or radiculopathy 12/04/2019   Vertigo 10/01/2016   Ankle edema 04/11/2015   Plantar fasciitis 12/31/2014   Aortic atherosclerosis ([facility]) 12/31/2014   Allergic state 12/31/2014   Essential (primary) hypertension 12/31/2014   Arthralgia of hip 12/31/2014   Prediabetes 12/31/2014   Migraine without aura and responsive to treatment 12/31/2014   Arthritis of knee, degenerative 12/31/2014   Periodic limb movement 12/31/2014   Apnea, sleep 12/31/2014    No Known Allergies  Past Surgical History:  Procedure Laterality Date   CATARACT EXTRACTION Left 2016   ENDOMETRIAL BIOPSY  2005   benign   KNEE ARTHROSCOPY Right 2009   TEMPORAL ARTERY BIOPSY / LIGATION  05/2013   negative    Social History   Tobacco Use   Smoking status: Never   Smokeless tobacco: Never  Vaping Use   Vaping Use: Never used  Substance Use Topics   Alcohol use: No    Alcohol/week: 0.0 standard drinks of alcohol     Medication list has been reviewed and updated.  Current Meds  Medication Sig   acetaminophen (TYLENOL) 500 MG tablet Take 1,000 mg by mouth every 6 (six) hours as needed for moderate pain.   atorvastatin (LIPITOR) 10 MG tablet Take 1 tablet (10 mg total) by mouth daily.   dextromethorphan-guaiFENesin (MUCINEX DM) 30-600 MG 12hr tablet Take 1 tablet by mouth 2 (two) times daily.   fexofenadine (ALLEGRA ALLERGY) 180 MG tablet Take 1 tablet (180 mg total) by mouth daily.   gabapentin (NEURONTIN) 100 MG capsule Take 1-3 capsules (100-300 mg total) by mouth at bedtime.    ibuprofen (ADVIL) 600 MG tablet TAKE 1 TABLET BY MOUTH EVERY 8 HOURS AS NEEDED   oxybutynin (DITROPAN XL) 5 MG 24 hr tablet Take 1 tablet (5 mg total) by mouth at bedtime.   [DISCONTINUED] hydrochlorothiazide (HYDRODIURIL) 25 MG tablet Take 1 tablet (25 mg total) by mouth daily.   [DISCONTINUED] pramipexole (MIRAPEX) 0.25 MG tablet Take 1 tablet by mouth at bedtime.       07/09/2022    8:53 AM 01/05/2022    8:04 AM 07/01/2021    8:17 AM 12/31/2020    8:17 AM  GAD 7 : Generalized Anxiety Score  Nervous, Anxious, on Edge 3 1 1  0  Control/stop worrying 3 1 1  0  Worry too much - different things 3 1 1  0  Trouble relaxing 3 3 1 3   Restless 3 1 1 3   Easily annoyed or irritable 0 1 0 0  Afraid - awful might happen 2 3 0 0  Total GAD 7 Score 17 11 5 6   Anxiety Difficulty Very difficult Not difficult at all Not difficult at all Not difficult at all       07/09/2022    8:53 AM 01/05/2022    7:59 AM 07/01/2021    8:17 AM  Depression screen PHQ 2/9  Decreased Interest 0 1 0  Down, Depressed, Hopeless 0 1 0  PHQ - 2 Score 0 2 0  Altered sleeping 0 1 0  Tired, decreased energy 3 1 0  Change in appetite 3 0 0  Feeling bad or failure about yourself  0 1 0  Trouble concentrating 0 2 2  Moving slowly or fidgety/restless 0 0 0  Suicidal thoughts 0 0 0  PHQ-9 Score 6 7 2   Difficult doing work/chores Not difficult at all Not difficult at all Not difficult at all    BP Readings from Last 3 Encounters:  07/09/22 126/78  01/05/22 128/76  07/01/21 128/80    Physical Exam Vitals and nursing note reviewed.  Constitutional:      General: She is not in acute distress.    Appearance: She is well-developed.  HENT:     Head: Normocephalic and atraumatic.  Cardiovascular:     Rate and Rhythm: Normal rate and regular rhythm.  Pulmonary:     Effort: Pulmonary effort is normal. No respiratory distress.     Breath sounds: No wheezing or rhonchi.  Musculoskeletal:        General: Swelling and  tenderness (right ankle medially) present.     Cervical back: Normal range of motion.  Skin:    General: Skin is warm and dry.     Findings: No rash.  Neurological:     General: No focal deficit present.     Mental Status: She is alert and oriented to person, place, and time.  Psychiatric:        Mood and Affect: Mood normal.        Behavior: Behavior normal.  Wt Readings from Last 3 Encounters:  07/09/22 223 lb 3.2 oz (101.2 kg)  01/05/22 216 lb (98 kg)  07/01/21 213 lb 12.8 oz (97 kg)    BP 126/78   Pulse (!) 58   Ht 5\' 4"  (1.626 m)   Wt 223 lb 3.2 oz (101.2 kg)   BMI 38.31 kg/m   Assessment and Plan:  Problem List Items Addressed This Visit       Cardiovascular and Mediastinum   Essential (primary) hypertension - Primary (Chronic)    Clinically stable exam with well controlled BP on hctz. Tolerating medications without side effects. Pt to continue current regimen and low sodium diet.       Relevant Medications   hydrochlorothiazide (HYDRODIURIL) 25 MG tablet     Other   Mild hyperlipidemia (Chronic)    Cholesterol elevated last visit. Atorvastatin 10 mg started. No side effects noted. Lab Results  Component Value Date   LDLCALC 172 (H) 01/05/2022        Relevant Medications   hydrochlorothiazide (HYDRODIURIL) 25 MG tablet   Periodic limb movement (Chronic)    Has been treated by Podiatry On Mirapex 0.25 mg with fair control of symptoms Will increase to 0.5 mg qhs      Relevant Medications   pramipexole (MIRAPEX) 0.25 MG tablet   Prediabetes (Chronic)    Diet controlled. Lab Results  Component Value Date   HGBA1C 6.3 (H) 01/05/2022        Other Visit Diagnoses     Need for vaccination for pneumococcus       Relevant Orders   Pneumococcal conjugate vaccine 20-valent       No follow-ups on file.   Partially dictated using Dragon software, any errors are not intentional.  Reubin Milan, MD [facility] [place], [place]

## 2022-09-10 ENCOUNTER — Ambulatory Visit: Payer: BC Managed Care – PPO | Admitting: Internal Medicine

## 2022-09-13 ENCOUNTER — Ambulatory Visit: Payer: BC Managed Care – PPO | Admitting: Internal Medicine

## 2022-09-19 IMAGING — US US PELVIS COMPLETE WITH TRANSVAGINAL
1 series · 13 of 25 positions shown · non-contrast
Comparison: None

CLINICAL DATA: Pelvic pain

EXAM:
TRANSABDOMINAL AND TRANSVAGINAL ULTRASOUND OF PELVIS
TECHNIQUE: Both transabdominal and transvaginal ultrasound examinations of the
pelvis were performed. Transabdominal technique was performed for
global imaging of the pelvis including uterus, ovaries, adnexal
regions, and pelvic cul-de-sac. It was necessary to proceed with
endovaginal exam following the transabdominal exam to visualize the
endometrium and ovaries.

[Series 1: us pelvic complete with transvaginal · 13 of 103 slices shown]
[im 1/103]
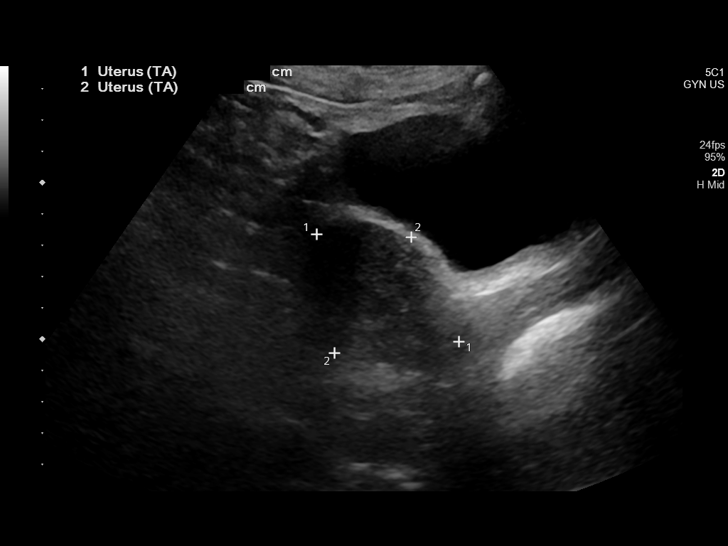
[im 9/103]
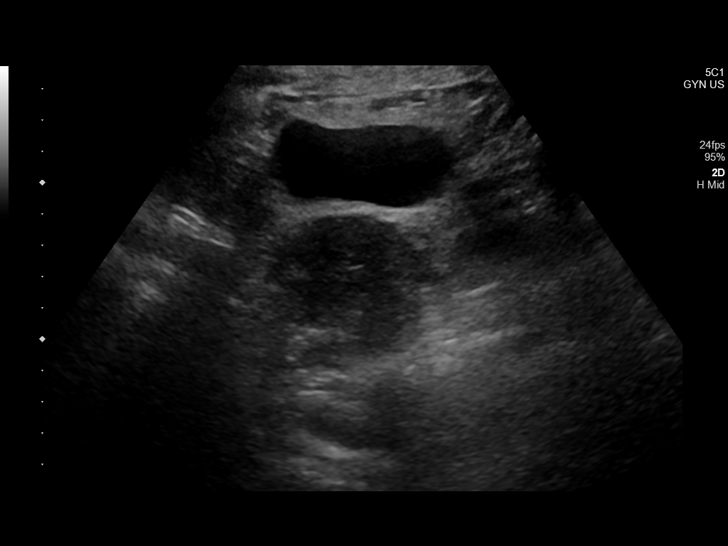
[im 18/103]
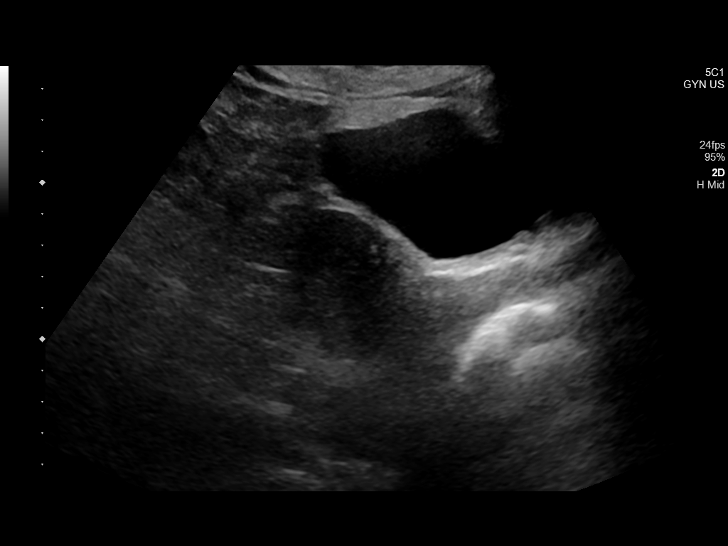
[im 26/103]
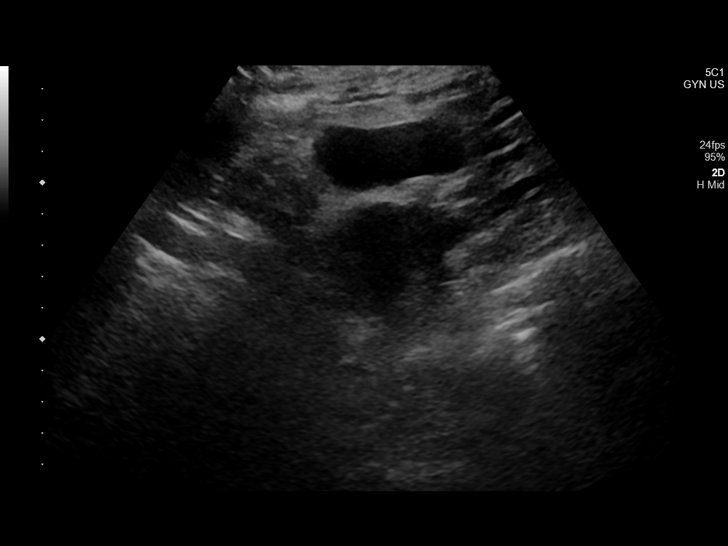
[im 35/103]
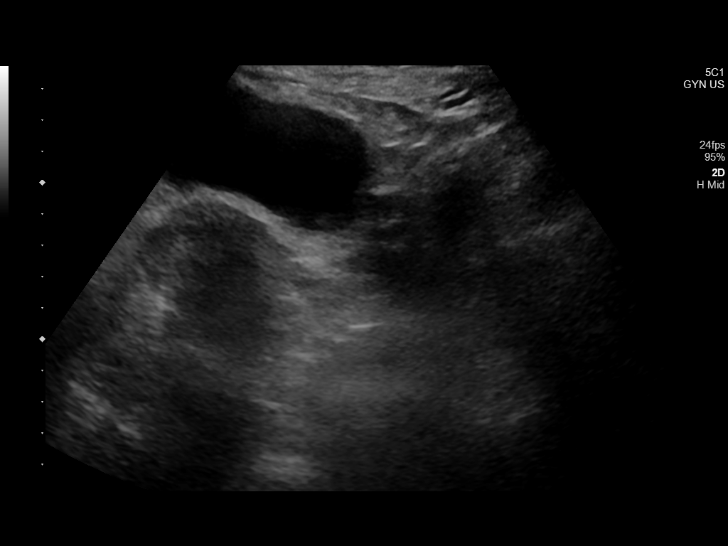
[im 43/103]
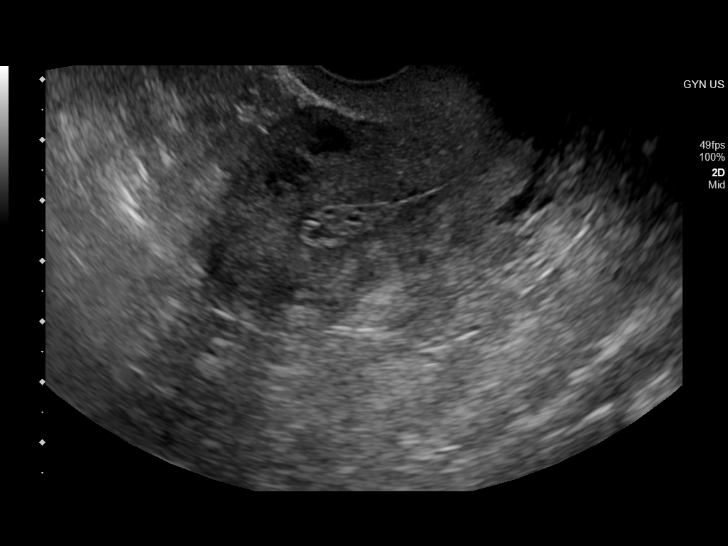
[im 52/103]
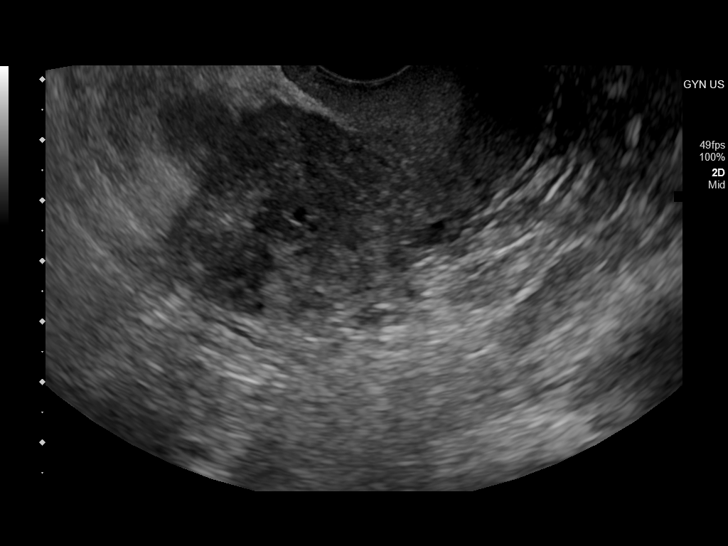
[im 60/103]
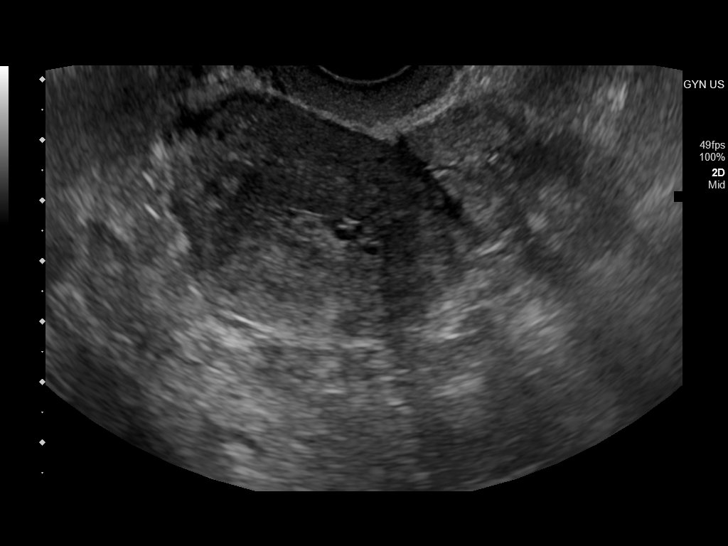
[im 69/103]
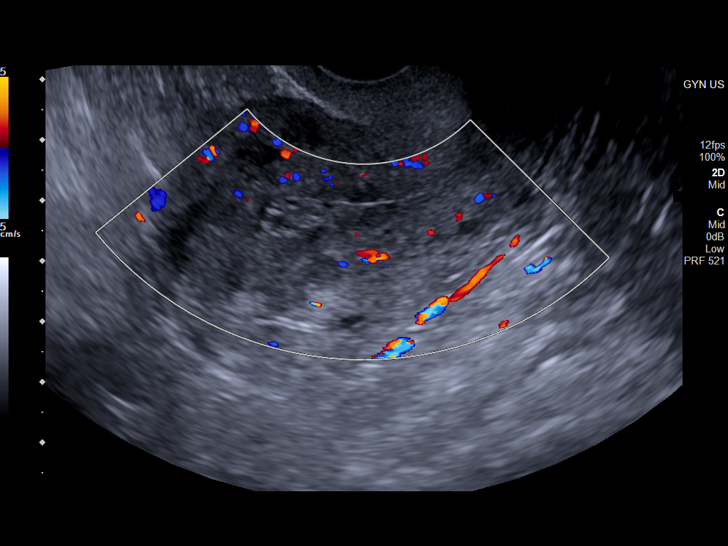
[im 77/103]
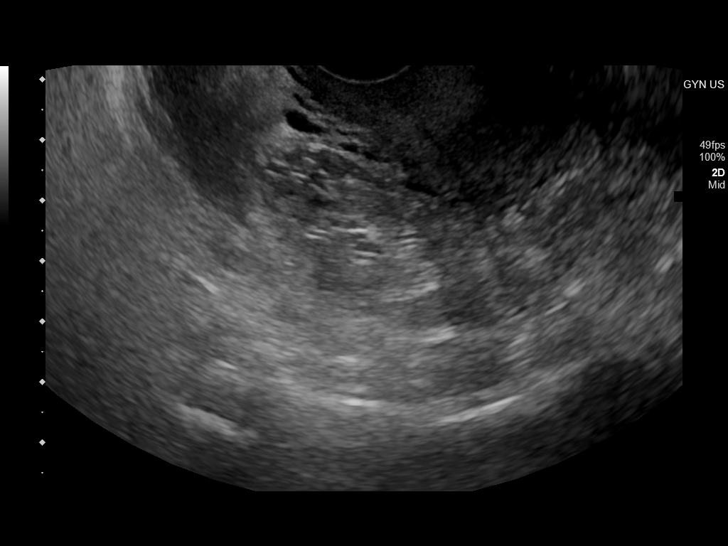
[im 86/103]
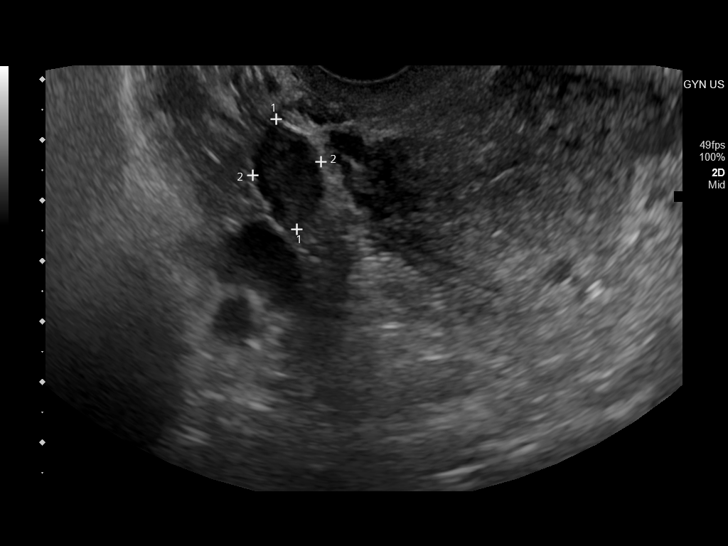
[im 94/103]
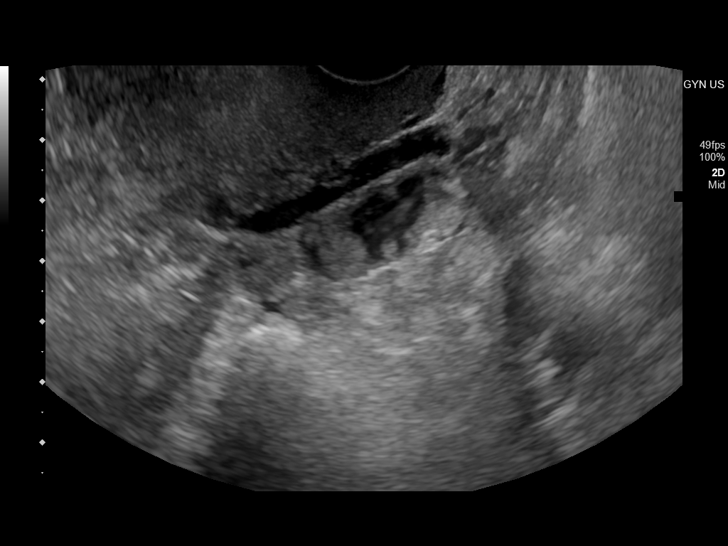
[im 103/103]
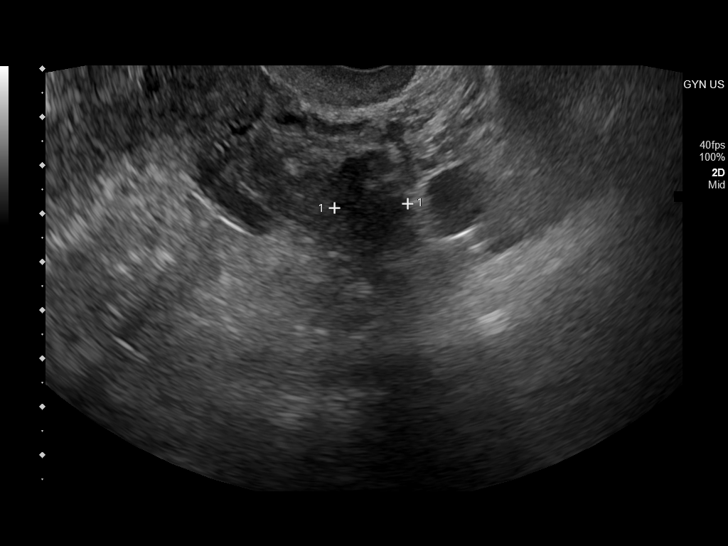

[13 of 25 positions shown; findings below may reference images not displayed]

FINDINGS: Uterus

Measurements: 5.7 x 3.3 x 4.8 cm = volume: 46.9 mL. Contains 2
fibroids measuring 11 x 13 x 11 mm and 9 x 6 x 8 mm

Endometrium

Thickness: 9 mm on cine images. Contains multiple cysts within the
mildly thickened endometrium.

Right ovary

Measurements: 1.9 x 1.2 x 0.9 cm = volume: 1.1 mL. Normal
appearance/no adnexal mass.

Left ovary

Measurements: 2.2 x 1.7 x 1.5 cm = volume: 2.9 mL. Normal
appearance/no adnexal mass.

Other findings

No abnormal free fluid.
IMPRESSION: 1. The endometrium is abnormal measuring up to 9 mm with cystic
changes in this postmenopausal woman. Recommend gynecologic
consultation and endometrial sampling.
2. Two fibroids in the uterus measuring 13 and 9 mm.
3. No other abnormalities.

These results will be called to the ordering clinician or
representative by the Radiologist Assistant, and communication
documented in the PACS or [REDACTED].

## 2022-10-08 ENCOUNTER — Ambulatory Visit: Payer: BC Managed Care – PPO | Admitting: Internal Medicine

## 2022-11-17 ENCOUNTER — Encounter: Payer: Self-pay | Admitting: Internal Medicine

## 2022-11-17 ENCOUNTER — Ambulatory Visit (INDEPENDENT_AMBULATORY_CARE_PROVIDER_SITE_OTHER): Payer: BC Managed Care – PPO | Admitting: Internal Medicine

## 2022-11-17 VITALS — BP 128/72 | HR 52 | Ht 64.0 in | Wt 224.0 lb

## 2022-11-17 DIAGNOSIS — I10 Essential (primary) hypertension: Secondary | ICD-10-CM

## 2022-11-17 DIAGNOSIS — M722 Plantar fascial fibromatosis: Secondary | ICD-10-CM | POA: Diagnosis not present

## 2022-11-17 DIAGNOSIS — Z23 Encounter for immunization: Secondary | ICD-10-CM

## 2022-11-17 NOTE — Assessment & Plan Note (Signed)
 Normal exam with stable BP on hctz. No concerns or side effects to current medication. No change in regimen; continue low sodium diet.

## 2022-11-17 NOTE — Progress Notes (Signed)
Date:  11/17/2022   Name:  Alicia Andersen   DOB:  [birth year]   MRN:  [id number]   Chief Complaint: Foot Pain (R) foot pain- has had approx 4 injections, last one being approx 2 months ago. Not working. By end of day, can't walk)  Foot Pain This is a chronic problem. The problem occurs constantly. The problem has been unchanged. Associated symptoms include arthralgias. Pertinent negatives include no chest pain, chills or fatigue. The symptoms are aggravated by standing and walking. Treatments tried: several steroid injections from Podiatry.    Lab Results  Component Value Date   NA 145 (H) 01/05/2022   K 3.8 01/05/2022   CO2 25 01/05/2022   GLUCOSE 68 (L) 01/05/2022   BUN 11 01/05/2022   CREATININE 0.82 01/05/2022   CALCIUM 10.3 01/05/2022   EGFR 80 01/05/2022   GFRNONAA 71 12/26/2019   Lab Results  Component Value Date   CHOL 266 (H) 01/05/2022   HDL 75 01/05/2022   LDLCALC 172 (H) 01/05/2022   TRIG 112 01/05/2022   CHOLHDL 3.5 01/05/2022   Lab Results  Component Value Date   TSH 2.160 01/05/2022   Lab Results  Component Value Date   HGBA1C 6.3 (H) 01/05/2022   Lab Results  Component Value Date   WBC 8.2 01/05/2022   HGB 13.1 01/05/2022   HCT 40.7 01/05/2022   MCV 87 01/05/2022   PLT 247 01/05/2022   Lab Results  Component Value Date   ALT 8 01/05/2022   AST 10 01/05/2022   ALKPHOS 115 01/05/2022   BILITOT 0.5 01/05/2022   No results found for: "25OHVITD2", "25OHVITD3", "VD25OH"   Review of Systems  Constitutional:  Negative for chills and fatigue.  Respiratory:  Negative for chest tightness and shortness of breath.   Cardiovascular:  Negative for chest pain and leg swelling.  Musculoskeletal:  Positive for arthralgias and gait problem.  Psychiatric/Behavioral:  Negative for dysphoric mood and sleep disturbance. The patient is not nervous/anxious.     Patient Active Problem List   Diagnosis Date Noted   Mild hyperlipidemia 01/05/2022    Rhinosinusitis 09/29/2020   Spondylosis of lumbar region without myelopathy or radiculopathy 12/04/2019   Vertigo 10/01/2016   Ankle edema 04/11/2015   Plantar fasciitis 12/31/2014   Aortic atherosclerosis (HCC) 12/31/2014   Allergic state 12/31/2014   Essential (primary) hypertension 12/31/2014   Arthralgia of hip 12/31/2014   Prediabetes 12/31/2014   Migraine without aura and responsive to treatment 12/31/2014   Arthritis of knee, degenerative 12/31/2014   Periodic limb movement 12/31/2014   Apnea, sleep 12/31/2014    No Known Allergies  Past Surgical History:  Procedure Laterality Date   CATARACT EXTRACTION Left 2016   ENDOMETRIAL BIOPSY  2005   benign   KNEE ARTHROSCOPY Right 2009   TEMPORAL ARTERY BIOPSY / LIGATION  05/2013   negative    Social History   Tobacco Use   Smoking status: Never   Smokeless tobacco: Never  Vaping Use   Vaping status: Never Used  Substance Use Topics   Alcohol use: No    Alcohol/week: 0.0 standard drinks of alcohol     Medication list has been reviewed and updated.  Current Meds  Medication Sig   acetaminophen (TYLENOL) 500 MG tablet Take 1,000 mg by mouth every 6 (six) hours as needed for moderate pain.   atorvastatin (LIPITOR) 10 MG tablet Take 1 tablet (10 mg total) by mouth daily.   fexofenadine (ALLEGRA ALLERGY) 180 MG tablet  Take 1 tablet (180 mg total) by mouth daily.   gabapentin (NEURONTIN) 100 MG capsule Take 1-3 capsules (100-300 mg total) by mouth at bedtime.   hydrochlorothiazide (HYDRODIURIL) 25 MG tablet Take 1 tablet (25 mg total) by mouth daily.   ibuprofen (ADVIL) 600 MG tablet TAKE 1 TABLET BY MOUTH EVERY 8 HOURS AS NEEDED   oxybutynin (DITROPAN XL) 5 MG 24 hr tablet Take 1 tablet (5 mg total) by mouth at bedtime.   [DISCONTINUED] dextromethorphan-guaiFENesin (MUCINEX DM) 30-600 MG 12hr tablet Take 1 tablet by mouth 2 (two) times daily.       11/17/2022    9:18 AM 07/09/2022    8:53 AM 01/05/2022    8:04 AM  07/01/2021    8:17 AM  GAD 7 : Generalized Anxiety Score  Nervous, Anxious, on Edge 1 3 1 1   Control/stop worrying 0 3 1 1   Worry too much - different things 0 3 1 1   Trouble relaxing 1 3 3 1   Restless 0 3 1 1   Easily annoyed or irritable 0 0 1 0  Afraid - awful might happen 0 2 3 0  Total GAD 7 Score 2 17 11 5   Anxiety Difficulty Not difficult at all Very difficult Not difficult at all Not difficult at all       11/17/2022    9:18 AM 07/09/2022    8:53 AM 01/05/2022    7:59 AM  Depression screen PHQ 2/9  Decreased Interest 0 0 1  Down, Depressed, Hopeless 0 0 1  PHQ - 2 Score 0 0 2  Altered sleeping 0 0 1  Tired, decreased energy 0 3 1  Change in appetite 1 3 0  Feeling bad or failure about yourself  0 0 1  Trouble concentrating 0 0 2  Moving slowly or fidgety/restless 0 0 0  Suicidal thoughts 0 0 0  PHQ-9 Score 1 6 7   Difficult doing work/chores Not difficult at all Not difficult at all Not difficult at all    BP Readings from Last 3 Encounters:  11/17/22 128/72  07/09/22 126/78  01/05/22 128/76    Physical Exam Vitals and nursing note reviewed.  Constitutional:      General: She is not in acute distress.    Appearance: She is well-developed.  HENT:     Head: Normocephalic and atraumatic.  Cardiovascular:     Rate and Rhythm: Normal rate and regular rhythm.  Pulmonary:     Effort: Pulmonary effort is normal. No respiratory distress.     Breath sounds: No wheezing or rhonchi.  Musculoskeletal:     Comments: Tender right achilles tendon, lateral foot and plantar aspect over the heel  Skin:    General: Skin is warm and dry.     Findings: No rash.  Neurological:     Mental Status: She is alert and oriented to person, place, and time.  Psychiatric:        Mood and Affect: Mood normal.        Behavior: Behavior normal.     Wt Readings from Last 3 Encounters:  11/17/22 224 lb (101.6 kg)  07/09/22 223 lb 3.2 oz (101.2 kg)  01/05/22 216 lb (98 kg)    BP  128/72   Pulse (!) 52   Ht 5\' 4"  (1.626 m)   Wt 224 lb (101.6 kg)   SpO2 95%   BMI 38.45 kg/m   Assessment and Plan:  Problem List Items Addressed This Visit  Unprioritized   Plantar fasciitis - Primary    Ongoing severe pain unrelieved for any period of time by injections       Relevant Orders   Ambulatory referral to Physical Therapy   Essential (primary) hypertension (Chronic)    Normal exam with stable BP on hctz. No concerns or side effects to current medication. No change in regimen; continue low sodium diet.       Other Visit Diagnoses     Need for influenza vaccination       Relevant Orders   Flu Vaccine Trivalent High Dose (Fluad) (Completed)       No follow-ups on file.    Reubin Milan, MD [facility] [place]

## 2022-11-17 NOTE — Assessment & Plan Note (Signed)
Ongoing severe pain unrelieved for any period of time by injections

## 2022-12-24 ENCOUNTER — Other Ambulatory Visit: Payer: Self-pay | Admitting: Internal Medicine

## 2022-12-24 DIAGNOSIS — M545 Low back pain, unspecified: Secondary | ICD-10-CM

## 2023-01-06 ENCOUNTER — Encounter: Payer: BC Managed Care – PPO | Admitting: Internal Medicine

## 2023-01-07 ENCOUNTER — Ambulatory Visit: Payer: BC Managed Care – PPO | Admitting: Internal Medicine

## 2023-01-10 ENCOUNTER — Encounter: Payer: Self-pay | Admitting: Internal Medicine

## 2023-01-10 ENCOUNTER — Telehealth: Payer: Self-pay | Admitting: Internal Medicine

## 2023-01-10 ENCOUNTER — Ambulatory Visit (INDEPENDENT_AMBULATORY_CARE_PROVIDER_SITE_OTHER): Payer: BC Managed Care – PPO | Admitting: Internal Medicine

## 2023-01-10 ENCOUNTER — Other Ambulatory Visit: Payer: Self-pay

## 2023-01-10 VITALS — BP 128/77 | HR 68 | Ht 64.0 in | Wt 225.0 lb

## 2023-01-10 DIAGNOSIS — I10 Essential (primary) hypertension: Secondary | ICD-10-CM | POA: Diagnosis not present

## 2023-01-10 DIAGNOSIS — R7303 Prediabetes: Secondary | ICD-10-CM

## 2023-01-10 DIAGNOSIS — M722 Plantar fascial fibromatosis: Secondary | ICD-10-CM

## 2023-01-10 DIAGNOSIS — E785 Hyperlipidemia, unspecified: Secondary | ICD-10-CM | POA: Diagnosis not present

## 2023-01-10 DIAGNOSIS — Z0000 Encounter for general adult medical examination without abnormal findings: Secondary | ICD-10-CM

## 2023-01-10 DIAGNOSIS — Z1231 Encounter for screening mammogram for malignant neoplasm of breast: Secondary | ICD-10-CM

## 2023-01-10 DIAGNOSIS — I700 Atherosclerosis of aorta: Secondary | ICD-10-CM

## 2023-01-10 MED ORDER — ATORVASTATIN CALCIUM 10 MG PO TABS: 10.0000 mg | ORAL_TABLET | Freq: Every day | ORAL | 1 refills | Status: DC

## 2023-01-10 MED ORDER — HYDROCHLOROTHIAZIDE 25 MG PO TABS: 25.0000 mg | ORAL_TABLET | Freq: Every day | ORAL | 1 refills | Status: DC

## 2023-01-10 NOTE — Progress Notes (Signed)
Date:  01/10/2023   Name:  Alicia Andersen   DOB:  Apr 30, 1955   MRN:  [id number]   Chief Complaint: Annual Exam Alicia Andersen is a 67 y.o. female who presents today for her Complete Annual Exam. She feels fairly well. She reports exercising - none. She reports she is sleeping poorly. Breast complaints - none.  Mammogram: 08/2021 @ [facility] DEXA: none Colonoscopy: 07/2022 repeat 10 yrs  Health Maintenance Due  Topic Date Due   MAMMOGRAM  09/10/2022   COVID-19 Vaccine (5 - 2023-24 season) 11/14/2022    Immunization History  Administered Date(s) Administered   Fluad Trivalent(High Dose 65+) 11/17/2022   Influenza,inj,Quad PF,6+ Mos 12/22/2018, 12/26/2019, 12/31/2020, 01/05/2022   Influenza-Unspecified 12/27/2014, 12/27/2017   Moderna Sars-Covid-2 Vaccination 05/23/2019, 06/22/2019, 02/01/2020   PNEUMOCOCCAL CONJUGATE-20 07/09/2022   Pfizer(Comirnaty)Fall Seasonal Vaccine 12 years and older 01/05/2022     Hypertension This is a chronic problem. The problem is controlled. Pertinent negatives include no chest pain, headaches, palpitations or shortness of breath. Past treatments include diuretics. There is no history of kidney disease, CAD/MI or CVA.  Hyperlipidemia This is a chronic problem. Pertinent negatives include no chest pain or shortness of breath. Current antihyperlipidemic treatment includes statins.    Review of Systems  Constitutional:  Negative for chills, fatigue and fever.  HENT:  Negative for congestion, hearing loss, tinnitus, trouble swallowing and voice change.   Eyes:  Negative for visual disturbance.  Respiratory:  Negative for cough, chest tightness, shortness of breath and wheezing.   Cardiovascular:  Negative for chest pain, palpitations and leg swelling.  Gastrointestinal:  Negative for abdominal pain, constipation, diarrhea and vomiting.  Endocrine: Negative for polydipsia and polyuria.  Genitourinary:  Negative for dysuria, frequency, genital sores,  vaginal bleeding and vaginal discharge.  Musculoskeletal:  Positive for gait problem (right leg pain and swelling - seeing Ortho). Negative for arthralgias and joint swelling.  Skin:  Negative for color change and rash.  Neurological:  Negative for dizziness, tremors, light-headedness and headaches.  Hematological:  Negative for adenopathy. Does not bruise/bleed easily.  Psychiatric/Behavioral:  Negative for dysphoric mood and sleep disturbance. The patient is not nervous/anxious.      Lab Results  Component Value Date   NA 145 (H) 01/05/2022   K 3.8 01/05/2022   CO2 25 01/05/2022   GLUCOSE 68 (L) 01/05/2022   BUN 11 01/05/2022   CREATININE 0.82 01/05/2022   CALCIUM 10.3 01/05/2022   EGFR 80 01/05/2022   GFRNONAA 71 12/26/2019   Lab Results  Component Value Date   CHOL 266 (H) 01/05/2022   HDL 75 01/05/2022   LDLCALC 172 (H) 01/05/2022   TRIG 112 01/05/2022   CHOLHDL 3.5 01/05/2022   Lab Results  Component Value Date   TSH 2.160 01/05/2022   Lab Results  Component Value Date   HGBA1C 6.3 (H) 01/05/2022   Lab Results  Component Value Date   WBC 8.2 01/05/2022   HGB 13.1 01/05/2022   HCT 40.7 01/05/2022   MCV 87 01/05/2022   PLT 247 01/05/2022   Lab Results  Component Value Date   ALT 8 01/05/2022   AST 10 01/05/2022   ALKPHOS 115 01/05/2022   BILITOT 0.5 01/05/2022   No results found for: "25OHVITD2", "25OHVITD3", "VD25OH"   Patient Active Problem List   Diagnosis Date Noted   Mild hyperlipidemia 01/05/2022   Spondylosis of lumbar region without myelopathy or radiculopathy 12/04/2019   Vertigo 10/01/2016   Ankle edema 04/11/2015   Plantar fasciitis  12/31/2014   Aortic atherosclerosis (HCC) 12/31/2014   Essential (primary) hypertension 12/31/2014   Arthralgia of hip 12/31/2014   Prediabetes 12/31/2014   Migraine without aura and responsive to treatment 12/31/2014   Arthritis of knee, degenerative 12/31/2014   Periodic limb movement 12/31/2014   Apnea,  sleep 12/31/2014    No Known Allergies  Past Surgical History:  Procedure Laterality Date   CATARACT EXTRACTION Left 2016   ENDOMETRIAL BIOPSY  2005   benign   KNEE ARTHROSCOPY Right 2009   TEMPORAL ARTERY BIOPSY / LIGATION  05/2013   negative    Social History   Tobacco Use   Smoking status: Never   Smokeless tobacco: Never  Vaping Use   Vaping status: Never Used  Substance Use Topics   Alcohol use: No    Alcohol/week: 0.0 standard drinks of alcohol     Medication list has been reviewed and updated.  Current Meds  Medication Sig   acetaminophen (TYLENOL) 500 MG tablet Take 1,000 mg by mouth every 6 (six) hours as needed for moderate pain.   fexofenadine (ALLEGRA ALLERGY) 180 MG tablet Take 1 tablet (180 mg total) by mouth daily.   ibuprofen (ADVIL) 600 MG tablet TAKE 1 TABLET BY MOUTH EVERY 8 HOURS AS NEEDED   [DISCONTINUED] atorvastatin (LIPITOR) 10 MG tablet Take 1 tablet (10 mg total) by mouth daily.   [DISCONTINUED] hydrochlorothiazide (HYDRODIURIL) 25 MG tablet Take 1 tablet (25 mg total) by mouth daily.   [DISCONTINUED] oxybutynin (DITROPAN XL) 5 MG 24 hr tablet Take 1 tablet (5 mg total) by mouth at bedtime.       01/10/2023    8:09 AM 11/17/2022    9:18 AM 07/09/2022    8:53 AM 01/05/2022    8:04 AM  GAD 7 : Generalized Anxiety Score  Nervous, Anxious, on Edge 3 1 3 1   Control/stop worrying 3 0 3 1  Worry too much - different things 3 0 3 1  Trouble relaxing 3 1 3 3   Restless 0 0 3 1  Easily annoyed or irritable 0 0 0 1  Afraid - awful might happen 3 0 2 3  Total GAD 7 Score 15 2 17 11   Anxiety Difficulty Very difficult Not difficult at all Very difficult Not difficult at all       01/10/2023    8:09 AM 11/17/2022    9:18 AM 07/09/2022    8:53 AM  Depression screen PHQ 2/9  Decreased Interest 0 0 0  Down, Depressed, Hopeless 0 0 0  PHQ - 2 Score 0 0 0  Altered sleeping 3 0 0  Tired, decreased energy 3 0 3  Change in appetite 0 1 3  Feeling bad  or failure about yourself  0 0 0  Trouble concentrating 0 0 0  Moving slowly or fidgety/restless 0 0 0  Suicidal thoughts 0 0 0  PHQ-9 Score 6 1 6   Difficult doing work/chores Not difficult at all Not difficult at all Not difficult at all    BP Readings from Last 3 Encounters:  01/10/23 128/77  11/17/22 128/72  07/09/22 126/78    Physical Exam Vitals and nursing note reviewed.  Constitutional:      General: She is not in acute distress.    Appearance: She is well-developed.  HENT:     Head: Normocephalic and atraumatic.     Right Ear: Tympanic membrane and ear canal normal.     Left Ear: Tympanic membrane and ear canal normal.  Nose:     Right Sinus: No maxillary sinus tenderness.     Left Sinus: No maxillary sinus tenderness.  Eyes:     General: No scleral icterus.       Right eye: No discharge.        Left eye: No discharge.     Conjunctiva/sclera: Conjunctivae normal.  Neck:     Thyroid: No thyromegaly.     Vascular: No carotid bruit.  Cardiovascular:     Rate and Rhythm: Normal rate and regular rhythm.     Pulses: Normal pulses.     Heart sounds: Normal heart sounds.  Pulmonary:     Effort: Pulmonary effort is normal. No respiratory distress.     Breath sounds: No wheezing.  Abdominal:     General: Bowel sounds are normal.     Palpations: Abdomen is soft.     Tenderness: There is no abdominal tenderness.  Musculoskeletal:        General: Swelling (minimal RLE swelling at the ankle) present.     Cervical back: Normal range of motion. No erythema.     Right lower leg: No edema.     Left lower leg: No edema.  Lymphadenopathy:     Cervical: No cervical adenopathy.  Skin:    General: Skin is warm and dry.     Findings: No rash.  Neurological:     Mental Status: She is alert and oriented to person, place, and time.     Cranial Nerves: No cranial nerve deficit.     Sensory: No sensory deficit.     Deep Tendon Reflexes: Reflexes are normal and symmetric.   Psychiatric:        Attention and Perception: Attention normal.        Mood and Affect: Mood normal.     Wt Readings from Last 3 Encounters:  01/10/23 225 lb (102.1 kg)  11/17/22 224 lb (101.6 kg)  07/09/22 223 lb 3.2 oz (101.2 kg)    BP 128/77 (BP Location: Left Arm, Cuff Size: Large)   Pulse 68   Ht 5\' 4"  (1.626 m)   Wt 225 lb (102.1 kg)   SpO2 97%   BMI 38.62 kg/m   Assessment and Plan:  Problem List Items Addressed This Visit       Unprioritized   Aortic atherosclerosis (HCC) (Chronic)   Relevant Medications   atorvastatin (LIPITOR) 10 MG tablet   hydrochlorothiazide (HYDRODIURIL) 25 MG tablet   Other Relevant Orders   Lipid panel   Essential (primary) hypertension (Chronic)    Normal exam with stable BP on hctz.        Relevant Medications   atorvastatin (LIPITOR) 10 MG tablet   hydrochlorothiazide (HYDRODIURIL) 25 MG tablet   Other Relevant Orders   CBC with Differential/Platelet   Comprehensive metabolic panel   TSH   Urinalysis, Routine w reflex microscopic   Mild hyperlipidemia (Chronic)    Lipitor started last year for LDL 173.  Med compliance is poor. Will obtain follow up labs and advise.       Relevant Medications   atorvastatin (LIPITOR) 10 MG tablet   hydrochlorothiazide (HYDRODIURIL) 25 MG tablet   Other Relevant Orders   Lipid panel   Prediabetes (Chronic)    Glucoses diet controlled. Lab Results  Component Value Date   HGBA1C 6.3 (H) 01/05/2022         Relevant Orders   Hemoglobin A1c   Other Visit Diagnoses     Annual physical exam    -  Primary   Relevant Orders   CBC with Differential/Platelet   Comprehensive metabolic panel   Hemoglobin A1c   Lipid panel   TSH   Encounter for screening mammogram for breast cancer       patient to schedule at [facility]       Return in about 6 months (around 07/11/2023) for HTN, lipid.    Reubin Milan, MD [facility] [place]

## 2023-01-10 NOTE — Patient Instructions (Signed)
Call [facility] to schedule your Mammogram at [phone].

## 2023-01-10 NOTE — Telephone Encounter (Signed)
Patient came back into office this morning and stated she wants a referral to see a Podiatrist at [facility].

## 2023-01-10 NOTE — Assessment & Plan Note (Addendum)
Lipitor started last year for LDL 173.  Med compliance is poor. Will obtain follow up labs and advise.

## 2023-01-10 NOTE — Telephone Encounter (Signed)
Referral placed.

## 2023-01-10 NOTE — Assessment & Plan Note (Signed)
Normal exam with stable BP on hctz.

## 2023-01-10 NOTE — Assessment & Plan Note (Signed)
Glucoses diet controlled. Lab Results  Component Value Date   HGBA1C 6.3 (H) 01/05/2022

## 2023-01-11 LAB — URINALYSIS, ROUTINE W REFLEX MICROSCOPIC
Bilirubin, UA: NEGATIVE
Glucose, UA: NEGATIVE
Ketones, UA: NEGATIVE
Leukocytes,UA: NEGATIVE
Nitrite, UA: NEGATIVE
Protein,UA: NEGATIVE
RBC, UA: NEGATIVE
Specific Gravity, UA: 1.018 (ref 1.005–1.030)
Urobilinogen, Ur: 0.2 mg/dL (ref 0.2–1.0)
pH, UA: 6.5 (ref 5.0–7.5)

## 2023-01-11 LAB — COMPREHENSIVE METABOLIC PANEL
ALT: 17 [IU]/L (ref 0–32)
AST: 15 [IU]/L (ref 0–40)
Albumin: 4.3 g/dL (ref 3.9–4.9)
Alkaline Phosphatase: 117 [IU]/L (ref 44–121)
BUN/Creatinine Ratio: 16 (ref 12–28)
BUN: 13 mg/dL (ref 8–27)
Bilirubin Total: 0.6 mg/dL (ref 0.0–1.2)
CO2: 26 mmol/L (ref 20–29)
Calcium: 10.8 mg/dL — ABNORMAL HIGH (ref 8.7–10.3)
Chloride: 101 mmol/L (ref 96–106)
Creatinine, Ser: 0.82 mg/dL (ref 0.57–1.00)
Globulin, Total: 2.9 g/dL (ref 1.5–4.5)
Glucose: 103 mg/dL — ABNORMAL HIGH (ref 70–99)
Potassium: 3.7 mmol/L (ref 3.5–5.2)
Sodium: 141 mmol/L (ref 134–144)
Total Protein: 7.2 g/dL (ref 6.0–8.5)
eGFR: 79 mL/min/{1.73_m2} (ref >59)

## 2023-01-11 LAB — CBC WITH DIFFERENTIAL/PLATELET
Basophils Absolute: 0 10*3/uL (ref 0.0–0.2)
Basos: 1 %
EOS (ABSOLUTE): 0.2 10*3/uL (ref 0.0–0.4)
Eos: 3 %
Hematocrit: 42.6 % (ref 34.0–46.6)
Hemoglobin: 13.5 g/dL (ref 11.1–15.9)
Immature Grans (Abs): 0 10*3/uL (ref 0.0–0.1)
Immature Granulocytes: 0 %
Lymphocytes Absolute: 2.2 10*3/uL (ref 0.7–3.1)
Lymphs: 29 %
MCH: 28.1 pg (ref 26.6–33.0)
MCHC: 31.7 g/dL (ref 31.5–35.7)
MCV: 89 fL (ref 79–97)
Monocytes Absolute: 0.7 10*3/uL (ref 0.1–0.9)
Monocytes: 9 %
Neutrophils Absolute: 4.4 10*3/uL (ref 1.4–7.0)
Neutrophils: 58 %
Platelets: 256 10*3/uL (ref 150–450)
RBC: 4.8 x10E6/uL (ref 3.77–5.28)
RDW: 13.3 % (ref 11.7–15.4)
WBC: 7.6 10*3/uL (ref 3.4–10.8)

## 2023-01-11 LAB — LIPID PANEL
Chol/HDL Ratio: 3.2 ratio (ref 0.0–4.4)
Cholesterol, Total: 237 mg/dL — ABNORMAL HIGH (ref 100–199)
HDL: 75 mg/dL (ref >39)
LDL Chol Calc (NIH): 145 mg/dL — ABNORMAL HIGH (ref 0–99)
Triglycerides: 100 mg/dL (ref 0–149)
VLDL Cholesterol Cal: 17 mg/dL (ref 5–40)

## 2023-01-11 LAB — TSH: TSH: 1.88 u[IU]/mL (ref 0.450–4.500)

## 2023-01-11 LAB — HEMOGLOBIN A1C
Est. average glucose Bld gHb Est-mCnc: 140 mg/dL
Hgb A1c MFr Bld: 6.5 % — ABNORMAL HIGH (ref 4.8–5.6)

## 2023-01-12 NOTE — Progress Notes (Signed)
Called pt could not leave VM. VM full.  KP

## 2023-01-26 LAB — HM MAMMOGRAPHY

## 2023-03-25 ENCOUNTER — Encounter: Payer: Self-pay | Admitting: Internal Medicine

## 2023-03-25 ENCOUNTER — Telehealth: Payer: Self-pay | Admitting: Internal Medicine

## 2023-03-25 NOTE — Telephone Encounter (Signed)
 Copied from CRM [id number]. Topic: General - Inquiry >> Mar 25, 2023 11:36 AM Myrick T wrote: Reason for CRM: patient stated she is an established patient of Dr Justus but there is no info of previous visits. Patient has an appt on Friday 1/17 please check for another record in the system for her.

## 2023-04-01 ENCOUNTER — Ambulatory Visit: Payer: Self-pay | Admitting: Internal Medicine

## 2023-04-28 ENCOUNTER — Telehealth: Payer: Self-pay | Admitting: Internal Medicine

## 2023-04-28 NOTE — Telephone Encounter (Signed)
Copied from CRM [id number]. Topic: Referral - Request for Referral >> Apr 28, 2023 10:53 AM Franchot Heidelberg wrote: Reason for CRM: Pt called requesting a referral for a chiropractor  [address] [facility] is close to her.

## 2023-04-28 NOTE — Telephone Encounter (Signed)
Please review.  KP

## 2023-04-29 ENCOUNTER — Other Ambulatory Visit: Payer: Self-pay

## 2023-04-29 DIAGNOSIS — M549 Dorsalgia, unspecified: Secondary | ICD-10-CM

## 2023-04-29 NOTE — Telephone Encounter (Signed)
Referral sent.  KP

## 2023-05-11 ENCOUNTER — Telehealth: Payer: Self-pay | Admitting: Internal Medicine

## 2023-05-11 NOTE — Telephone Encounter (Signed)
 Copied from CRM [id number]. Topic: Referral - Status >> May 11, 2023 10:17 AM Franchot Heidelberg wrote: Reason for CRM: Dr. Nicanor Alcon from [facility] in [place] called reporting that they are missing the patient's DOB and Contact information   Best contact: [phone] Fax: [phone]  Wants to know if there are any special notations that he needs to make when it comes to the forms for billing? Please advise.

## 2023-05-11 NOTE — Telephone Encounter (Signed)
 Faxed Demographics sheet to their office.

## 2023-06-14 DIAGNOSIS — S46001D Unspecified injury of muscle(s) and tendon(s) of the rotator cuff of right shoulder, subsequent encounter: Secondary | ICD-10-CM | POA: Diagnosis not present

## 2023-06-14 DIAGNOSIS — S46002D Unspecified injury of muscle(s) and tendon(s) of the rotator cuff of left shoulder, subsequent encounter: Secondary | ICD-10-CM | POA: Diagnosis not present

## 2023-06-23 ENCOUNTER — Ambulatory Visit: Payer: Self-pay | Admitting: Internal Medicine

## 2023-06-23 ENCOUNTER — Encounter: Payer: Self-pay | Admitting: Internal Medicine

## 2023-06-29 ENCOUNTER — Encounter: Payer: Self-pay | Admitting: Internal Medicine

## 2023-06-29 ENCOUNTER — Ambulatory Visit (INDEPENDENT_AMBULATORY_CARE_PROVIDER_SITE_OTHER): Admitting: Internal Medicine

## 2023-06-29 DIAGNOSIS — M19071 Primary osteoarthritis, right ankle and foot: Secondary | ICD-10-CM | POA: Insufficient documentation

## 2023-06-29 DIAGNOSIS — M12811 Other specific arthropathies, not elsewhere classified, right shoulder: Secondary | ICD-10-CM | POA: Insufficient documentation

## 2023-06-29 DIAGNOSIS — M19072 Primary osteoarthritis, left ankle and foot: Secondary | ICD-10-CM

## 2023-06-29 DIAGNOSIS — E785 Hyperlipidemia, unspecified: Secondary | ICD-10-CM | POA: Diagnosis not present

## 2023-06-29 DIAGNOSIS — M75101 Unspecified rotator cuff tear or rupture of right shoulder, not specified as traumatic: Secondary | ICD-10-CM

## 2023-06-29 DIAGNOSIS — G4752 REM sleep behavior disorder: Secondary | ICD-10-CM | POA: Diagnosis not present

## 2023-06-29 DIAGNOSIS — I10 Essential (primary) hypertension: Secondary | ICD-10-CM | POA: Diagnosis not present

## 2023-06-29 DIAGNOSIS — R7303 Prediabetes: Secondary | ICD-10-CM | POA: Diagnosis not present

## 2023-06-29 MED ORDER — CLONAZEPAM 0.5 MG PO TABS: 0.5000 mg | ORAL_TABLET | Freq: Every day | ORAL | 0 refills | Status: AC

## 2023-06-29 MED ORDER — ATORVASTATIN CALCIUM 10 MG PO TABS: 10.0000 mg | ORAL_TABLET | Freq: Every day | ORAL | 1 refills | Status: DC

## 2023-06-29 NOTE — Assessment & Plan Note (Signed)
 Controlled with diet but last A1C at upper end of prediabetes. Reinforced diet changes.  Will recheck today.

## 2023-06-29 NOTE — Assessment & Plan Note (Signed)
 Will try low dose clonazepam nightly.

## 2023-06-29 NOTE — Assessment & Plan Note (Signed)
 Being evaluated and treated by [facility]. Now in PT
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
## 2023-06-29 NOTE — Assessment & Plan Note (Signed)
 Long standing ankle swelling and seeing Podiatry Has not had recent follow up due to insurance issues since retiring. Continue advil and see Podiatry for treatment

## 2023-06-29 NOTE — Assessment & Plan Note (Signed)
 BP borderline on hydrochlorothiazide Calcium elevated last visit Will check labs and PTH May need to change therapy

## 2023-06-30 LAB — COMPREHENSIVE METABOLIC PANEL WITH GFR
ALT: 13 IU/L (ref 0–32)
AST: 13 IU/L (ref 0–40)
Albumin: 4.2 g/dL (ref 3.9–4.9)
Alkaline Phosphatase: 124 IU/L — ABNORMAL HIGH (ref 44–121)
BUN/Creatinine Ratio: 13 (ref 12–28)
BUN: 10 mg/dL (ref 8–27)
Bilirubin Total: 0.6 mg/dL (ref 0.0–1.2)
CO2: 26 mmol/L (ref 20–29)
Calcium: 10.4 mg/dL — ABNORMAL HIGH (ref 8.7–10.3)
Chloride: 101 mmol/L (ref 96–106)
Creatinine, Ser: 0.77 mg/dL (ref 0.57–1.00)
Globulin, Total: 2.8 g/dL (ref 1.5–4.5)
Glucose: 113 mg/dL — ABNORMAL HIGH (ref 70–99)
Potassium: 3.7 mmol/L (ref 3.5–5.2)
Sodium: 143 mmol/L (ref 134–144)
Total Protein: 7 g/dL (ref 6.0–8.5)
eGFR: 85 mL/min/{1.73_m2} (ref >59)

## 2023-06-30 LAB — HEMOGLOBIN A1C
Est. average glucose Bld gHb Est-mCnc: 131 mg/dL
Hgb A1c MFr Bld: 6.2 % — ABNORMAL HIGH (ref 4.8–5.6)

## 2023-06-30 LAB — PARATHYROID HORMONE, INTACT (NO CA): PTH: 48 pg/mL (ref 15–65)

## 2023-07-18 ENCOUNTER — Telehealth: Payer: Self-pay

## 2023-07-18 DIAGNOSIS — M25511 Pain in right shoulder: Secondary | ICD-10-CM | POA: Diagnosis not present

## 2023-07-18 DIAGNOSIS — M25512 Pain in left shoulder: Secondary | ICD-10-CM | POA: Diagnosis not present

## 2023-07-18 DIAGNOSIS — M75112 Incomplete rotator cuff tear or rupture of left shoulder, not specified as traumatic: Secondary | ICD-10-CM | POA: Diagnosis not present

## 2023-07-18 NOTE — Telephone Encounter (Signed)
 Called [facility] pharmacy about patient's prescriptions and have them fill the prescriptions and was told they would be ready in 1 hour. Called patient to let her know, patient verbalized understanding.

## 2023-07-18 NOTE — Telephone Encounter (Signed)
 Copied from CRM [id number]. Topic: Clinical - Prescription Issue >> Jul 18, 2023  3:21 PM Ivette P wrote: Reason for CRM: pt called in because she went to her [facility] pharmacy to pick up medication that were prescribed on 06/29/23. Pt was told there was no record of any new medication for her.   Pt is supposed to be picking up: atorvastatin  (LIPITOR) 10 MG tablet clonazePAM  (KLONOPIN ) 0.5 MG tablet    Callback [phone]

## 2023-07-22 ENCOUNTER — Telehealth: Payer: Self-pay

## 2023-07-22 ENCOUNTER — Other Ambulatory Visit: Payer: Self-pay | Admitting: Internal Medicine

## 2023-07-22 DIAGNOSIS — I10 Essential (primary) hypertension: Secondary | ICD-10-CM

## 2023-07-22 MED ORDER — HYDROCHLOROTHIAZIDE 25 MG PO TABS: 25.0000 mg | ORAL_TABLET | Freq: Every day | ORAL | 1 refills | Status: DC

## 2023-07-22 NOTE — Telephone Encounter (Signed)
 Copied from CRM [id number]. Topic: Clinical - Prescription Issue >> Jul 22, 2023  9:15 AM Alicia Andersen wrote: Reason for CRM: Patient would like to know if she should be taking the hydrochlorothiazide  (HYDRODIURIL ) 25 MG tablet [[id number]] for her blood pressure or a new medication that was suppose to be prescribed at her last appointment//Please call the patient to advise//

## 2023-07-22 NOTE — Telephone Encounter (Signed)
 Spoke with patient and she was a little confused about the medications she picked up the other day 05//05/25 (Klonopin  and Lipitor), she knew that was not her blood pressure meds. I informed her you sent that in just now.. She finally understood and verbalized that she understood now.

## 2023-07-25 DIAGNOSIS — G629 Polyneuropathy, unspecified: Secondary | ICD-10-CM | POA: Diagnosis not present

## 2023-07-25 DIAGNOSIS — M2142 Flat foot [pes planus] (acquired), left foot: Secondary | ICD-10-CM | POA: Diagnosis not present

## 2023-07-25 DIAGNOSIS — B351 Tinea unguium: Secondary | ICD-10-CM | POA: Diagnosis not present

## 2023-07-25 DIAGNOSIS — E785 Hyperlipidemia, unspecified: Secondary | ICD-10-CM | POA: Diagnosis not present

## 2023-07-25 DIAGNOSIS — M76821 Posterior tibial tendinitis, right leg: Secondary | ICD-10-CM | POA: Diagnosis not present

## 2023-07-25 DIAGNOSIS — M7662 Achilles tendinitis, left leg: Secondary | ICD-10-CM | POA: Diagnosis not present

## 2023-07-25 DIAGNOSIS — I10 Essential (primary) hypertension: Secondary | ICD-10-CM | POA: Diagnosis not present

## 2023-07-25 DIAGNOSIS — M7661 Achilles tendinitis, right leg: Secondary | ICD-10-CM | POA: Diagnosis not present

## 2023-07-25 DIAGNOSIS — M19071 Primary osteoarthritis, right ankle and foot: Secondary | ICD-10-CM | POA: Diagnosis not present

## 2023-07-25 DIAGNOSIS — M2141 Flat foot [pes planus] (acquired), right foot: Secondary | ICD-10-CM | POA: Diagnosis not present

## 2023-07-25 DIAGNOSIS — M722 Plantar fascial fibromatosis: Secondary | ICD-10-CM | POA: Diagnosis not present

## 2023-07-26 DIAGNOSIS — M25512 Pain in left shoulder: Secondary | ICD-10-CM | POA: Diagnosis not present

## 2023-07-26 DIAGNOSIS — M25511 Pain in right shoulder: Secondary | ICD-10-CM | POA: Diagnosis not present

## 2023-08-03 DIAGNOSIS — M25512 Pain in left shoulder: Secondary | ICD-10-CM | POA: Diagnosis not present

## 2023-08-03 DIAGNOSIS — M25511 Pain in right shoulder: Secondary | ICD-10-CM | POA: Diagnosis not present

## 2023-08-03 DIAGNOSIS — M75112 Incomplete rotator cuff tear or rupture of left shoulder, not specified as traumatic: Secondary | ICD-10-CM | POA: Diagnosis not present

## 2023-09-20 DIAGNOSIS — M75122 Complete rotator cuff tear or rupture of left shoulder, not specified as traumatic: Secondary | ICD-10-CM | POA: Diagnosis not present

## 2023-09-20 DIAGNOSIS — M7522 Bicipital tendinitis, left shoulder: Secondary | ICD-10-CM | POA: Diagnosis not present

## 2023-09-28 ENCOUNTER — Encounter: Payer: Self-pay | Admitting: Internal Medicine

## 2023-09-28 ENCOUNTER — Ambulatory Visit (INDEPENDENT_AMBULATORY_CARE_PROVIDER_SITE_OTHER): Admitting: Internal Medicine

## 2023-09-28 VITALS — BP 126/84 | Ht 64.0 in | Wt 229.0 lb

## 2023-09-28 DIAGNOSIS — R7303 Prediabetes: Secondary | ICD-10-CM | POA: Diagnosis not present

## 2023-09-28 DIAGNOSIS — R42 Dizziness and giddiness: Secondary | ICD-10-CM | POA: Diagnosis not present

## 2023-09-28 DIAGNOSIS — I10 Essential (primary) hypertension: Secondary | ICD-10-CM | POA: Diagnosis not present

## 2023-09-28 DIAGNOSIS — M12811 Other specific arthropathies, not elsewhere classified, right shoulder: Secondary | ICD-10-CM

## 2023-09-28 DIAGNOSIS — M75101 Unspecified rotator cuff tear or rupture of right shoulder, not specified as traumatic: Secondary | ICD-10-CM

## 2023-09-28 MED ORDER — FLUTICASONE PROPIONATE 50 MCG/ACT NA SUSP: 2.0000 | Freq: Every day | NASAL | 6 refills | Status: AC

## 2023-09-28 NOTE — Assessment & Plan Note (Signed)
 Surgery planned this month for left rotator cuff and then a right shoulder replacement. [facility] Dr. Kayla

## 2023-09-28 NOTE — Assessment & Plan Note (Addendum)
 Ongoing for many years and not worsening She has ENT and had a scope.  She takes Flonase  prn. Recommend flonase  daily and ENT follow up if worsening

## 2023-09-28 NOTE — Patient Instructions (Addendum)
 Call [facility] to schedule your Mammogram at [phone].   Work on diet - cut out sweet drinks, juice, soda, tea and cut way back on potatoes and pasta.  Limit the bread as well.

## 2023-09-28 NOTE — Assessment & Plan Note (Signed)
 Blood pressure is well controlled.  Current medications are hydrochlorothiazide . Will continue same regimen along with efforts to limit dietary sodium.

## 2023-09-28 NOTE — Progress Notes (Signed)
 Date:  09/28/2023   Name:  Alicia Andersen   DOB:  02/16/1956   MRN:  [id number]   Chief Complaint: Hypertension and Prediabetes  Hypertension This is a chronic problem. The problem is controlled. Pertinent negatives include no chest pain, headaches, palpitations or shortness of breath. Past treatments include diuretics. The current treatment provides significant improvement.  Diabetes She presents for her follow-up diabetic visit. Diabetes type: prediabetes. Pertinent negatives for hypoglycemia include no dizziness, headaches or nervousness/anxiousness. Pertinent negatives for diabetes include no chest pain, no fatigue and no weakness.    Review of Systems  Constitutional:  Negative for fatigue and unexpected weight change.  HENT:  Negative for trouble swallowing.   Eyes:  Negative for visual disturbance.  Respiratory:  Negative for cough, chest tightness, shortness of breath and wheezing.   Cardiovascular:  Negative for chest pain, palpitations and leg swelling.  Gastrointestinal:  Negative for abdominal pain, constipation and diarrhea.  Musculoskeletal:  Negative for arthralgias and myalgias.  Neurological:  Negative for dizziness, weakness, light-headedness and headaches.  Psychiatric/Behavioral:  Negative for dysphoric mood and sleep disturbance. The patient is not nervous/anxious.      Lab Results  Component Value Date   NA 143 06/29/2023   K 3.7 06/29/2023   CO2 26 06/29/2023   GLUCOSE 113 (H) 06/29/2023   BUN 10 06/29/2023   CREATININE 0.77 06/29/2023   CALCIUM  10.4 (H) 06/29/2023   EGFR 85 06/29/2023   GFRNONAA 71 12/26/2019   Lab Results  Component Value Date   CHOL 237 (H) 01/10/2023   HDL 75 01/10/2023   LDLCALC 145 (H) 01/10/2023   TRIG 100 01/10/2023   CHOLHDL 3.2 01/10/2023   Lab Results  Component Value Date   TSH 1.880 01/10/2023   Lab Results  Component Value Date   HGBA1C 6.2 (H) 06/29/2023   Lab Results  Component Value Date   WBC 7.6  01/10/2023   HGB 13.5 01/10/2023   HCT 42.6 01/10/2023   MCV 89 01/10/2023   PLT 256 01/10/2023   Lab Results  Component Value Date   ALT 13 06/29/2023   AST 13 06/29/2023   ALKPHOS 124 (H) 06/29/2023   BILITOT 0.6 06/29/2023   No results found for: MARIEN BOLLS, VD25OH   Patient Active Problem List   Diagnosis Date Noted   Arthritis of both ankles 06/29/2023   Rotator cuff tear arthropathy of right shoulder 06/29/2023   REM sleep behavior disorder 06/29/2023   Mild hyperlipidemia 01/05/2022   Spondylosis of lumbar region without myelopathy or radiculopathy 12/04/2019   Vertigo 10/01/2016   Ankle edema 04/11/2015   Plantar fasciitis 12/31/2014   Aortic atherosclerosis (HCC) 12/31/2014   Essential (primary) hypertension 12/31/2014   Arthralgia of hip 12/31/2014   Prediabetes 12/31/2014   Migraine without aura and responsive to treatment 12/31/2014   Arthritis of knee, degenerative 12/31/2014   Periodic limb movement 12/31/2014   Apnea, sleep 12/31/2014    No Known Allergies  Past Surgical History:  Procedure Laterality Date   CATARACT EXTRACTION Left 2016   ENDOMETRIAL BIOPSY  2005   benign   KNEE ARTHROSCOPY Right 2009   TEMPORAL ARTERY BIOPSY / LIGATION  05/2013   negative    Social History   Tobacco Use   Smoking status: Never   Smokeless tobacco: Never  Vaping Use   Vaping status: Never Used  Substance Use Topics   Alcohol use: No    Alcohol/week: 0.0 standard drinks of alcohol     Medication  list has been reviewed and updated.  Current Meds  Medication Sig   acetaminophen (TYLENOL) 500 MG tablet Take 1,000 mg by mouth every 6 (six) hours as needed for moderate pain.   atorvastatin  (LIPITOR) 10 MG tablet Take 1 tablet (10 mg total) by mouth daily.   clonazePAM  (KLONOPIN ) 0.5 MG tablet Take 1 tablet (0.5 mg total) by mouth at bedtime.   fexofenadine  (ALLEGRA  ALLERGY) 180 MG tablet Take 1 tablet (180 mg total) by mouth daily.    fluticasone  (FLONASE ) 50 MCG/ACT nasal spray Place 2 sprays into both nostrils daily.   hydrochlorothiazide  (HYDRODIURIL ) 25 MG tablet Take 1 tablet (25 mg total) by mouth daily.   ibuprofen  (ADVIL ) 600 MG tablet TAKE 1 TABLET BY MOUTH EVERY 8 HOURS AS NEEDED       09/28/2023   10:41 AM 06/29/2023    1:55 PM 01/10/2023    8:09 AM 11/17/2022    9:18 AM  GAD 7 : Generalized Anxiety Score  Nervous, Anxious, on Edge 1 1 3 1   Control/stop worrying 1 1 3  0  Worry too much - different things 1 0 3 0  Trouble relaxing 2 0 3 1  Restless 1 0 0 0  Easily annoyed or irritable 1 0 0 0  Afraid - awful might happen 2 0 3 0  Total GAD 7 Score 9 2 15 2   Anxiety Difficulty Somewhat difficult Not difficult at all Very difficult Not difficult at all       09/28/2023   10:40 AM 06/29/2023    1:55 PM 01/10/2023    8:09 AM  Depression screen PHQ 2/9  Decreased Interest 2 1 0  Down, Depressed, Hopeless 1 1 0  PHQ - 2 Score 3 2 0  Altered sleeping 3 1 3   Tired, decreased energy 3 0 3  Change in appetite 0 0 0  Feeling bad or failure about yourself  0 1 0  Trouble concentrating 2 0 0  Moving slowly or fidgety/restless 1 0 0  Suicidal thoughts 0  0  PHQ-9 Score 12 4 6   Difficult doing work/chores Somewhat difficult  Not difficult at all    BP Readings from Last 3 Encounters:  09/28/23 126/84  06/29/23 122/80  01/10/23 128/77    Physical Exam Vitals and nursing note reviewed.  Constitutional:      General: She is not in acute distress.    Appearance: She is well-developed. She is obese.  HENT:     Head: Normocephalic and atraumatic.  Neck:     Vascular: No carotid bruit.  Cardiovascular:     Rate and Rhythm: Normal rate and regular rhythm.     Heart sounds: No murmur heard. Pulmonary:     Effort: Pulmonary effort is normal. No respiratory distress.     Breath sounds: No wheezing or rhonchi.  Musculoskeletal:     Cervical back: Normal range of motion.     Right lower leg: No edema.      Left lower leg: No edema.  Lymphadenopathy:     Cervical: No cervical adenopathy.  Skin:    General: Skin is warm and dry.     Capillary Refill: Capillary refill takes less than 2 seconds.     Findings: No rash.  Neurological:     General: No focal deficit present.     Mental Status: She is alert and oriented to person, place, and time.  Psychiatric:        Mood and Affect: Mood normal.  Behavior: Behavior normal.     Wt Readings from Last 3 Encounters:  09/28/23 229 lb (103.9 kg)  06/29/23 229 lb (103.9 kg)  01/10/23 225 lb (102.1 kg)    BP 126/84   Ht 5' 4 (1.626 m)   Wt 229 lb (103.9 kg)   BMI 39.31 kg/m   Assessment and Plan:  Problem List Items Addressed This Visit       Unprioritized   Essential (primary) hypertension - Primary (Chronic)   Blood pressure is well controlled.  Current medications are hydrochlorothiazide . Will continue same regimen along with efforts to limit dietary sodium.       Prediabetes (Chronic)   Managed with diet changes.  She has not really been making any diet changes so these are discussed in detail.  Limit bread and cut out sweet drinks and juice, potatoes and pasta. Will check A1C and weight at CPE in October Lab Results  Component Value Date   HGBA1C 6.2 (H) 06/29/2023         Rotator cuff tear arthropathy of right shoulder (Chronic)   Surgery planned this month for left rotator cuff and then a right shoulder replacement. [facility] Dr. Kayla      Vertigo   Ongoing for many years and not worsening She has ENT and had a scope.  She takes Flonase  prn. Recommend flonase  daily and ENT follow up if worsening      Relevant Medications   fluticasone  (FLONASE ) 50 MCG/ACT nasal spray    No follow-ups on file.    Leita HILARIO Adie, MD [facility] [place]

## 2023-09-28 NOTE — Assessment & Plan Note (Addendum)
 Managed with diet changes.  She has not really been making any diet changes so these are discussed in detail.  Limit bread and cut out sweet drinks and juice, potatoes and pasta. Will check A1C and weight at CPE in October Lab Results  Component Value Date   HGBA1C 6.2 (H) 06/29/2023

## 2023-10-05 DIAGNOSIS — S46212A Strain of muscle, fascia and tendon of other parts of biceps, left arm, initial encounter: Secondary | ICD-10-CM | POA: Diagnosis not present

## 2023-10-05 DIAGNOSIS — G8918 Other acute postprocedural pain: Secondary | ICD-10-CM | POA: Diagnosis not present

## 2023-10-05 DIAGNOSIS — M7542 Impingement syndrome of left shoulder: Secondary | ICD-10-CM | POA: Diagnosis not present

## 2023-10-05 DIAGNOSIS — M25122 Fistula, left elbow: Secondary | ICD-10-CM | POA: Diagnosis not present

## 2023-10-05 DIAGNOSIS — M19012 Primary osteoarthritis, left shoulder: Secondary | ICD-10-CM | POA: Diagnosis not present

## 2023-10-05 DIAGNOSIS — E669 Obesity, unspecified: Secondary | ICD-10-CM | POA: Diagnosis not present

## 2023-10-05 DIAGNOSIS — M65812 Other synovitis and tenosynovitis, left shoulder: Secondary | ICD-10-CM | POA: Diagnosis not present

## 2023-10-05 DIAGNOSIS — Z6839 Body mass index (BMI) 39.0-39.9, adult: Secondary | ICD-10-CM | POA: Diagnosis not present

## 2023-10-05 DIAGNOSIS — S43432A Superior glenoid labrum lesion of left shoulder, initial encounter: Secondary | ICD-10-CM | POA: Diagnosis not present

## 2023-10-05 DIAGNOSIS — M7522 Bicipital tendinitis, left shoulder: Secondary | ICD-10-CM | POA: Diagnosis not present

## 2023-10-05 DIAGNOSIS — M24112 Other articular cartilage disorders, left shoulder: Secondary | ICD-10-CM | POA: Diagnosis not present

## 2023-10-05 DIAGNOSIS — M75122 Complete rotator cuff tear or rupture of left shoulder, not specified as traumatic: Secondary | ICD-10-CM | POA: Diagnosis not present

## 2023-10-05 DIAGNOSIS — M25812 Other specified joint disorders, left shoulder: Secondary | ICD-10-CM | POA: Diagnosis not present

## 2023-10-24 DIAGNOSIS — M25512 Pain in left shoulder: Secondary | ICD-10-CM | POA: Diagnosis not present

## 2023-10-24 DIAGNOSIS — R29898 Other symptoms and signs involving the musculoskeletal system: Secondary | ICD-10-CM | POA: Diagnosis not present

## 2023-10-24 DIAGNOSIS — Z9889 Other specified postprocedural states: Secondary | ICD-10-CM | POA: Diagnosis not present

## 2023-10-26 ENCOUNTER — Ambulatory Visit: Payer: Self-pay

## 2023-10-26 NOTE — Telephone Encounter (Signed)
 Copied from CRM [id number]. Topic: Clinical - Medical Advice >> Oct 26, 2023  3:27 PM Turkey B wrote: Patient says feels bad with sinus issues, throat sore runny nose, no appt unti 08/27 . She is requesting  med to help without an appt possible

## 2023-10-26 NOTE — Telephone Encounter (Signed)
 This RN attempted to contact patient. No answer.LVM. Will route to office for follow up.

## 2023-10-26 NOTE — Telephone Encounter (Signed)
 This RN attempted to contact patient, no answer unable to leave voicemail.

## 2023-10-27 NOTE — Telephone Encounter (Signed)
 Called pt could not leave VM. VM is full.  KP

## 2023-10-31 DIAGNOSIS — M25512 Pain in left shoulder: Secondary | ICD-10-CM | POA: Diagnosis not present

## 2023-10-31 DIAGNOSIS — R29898 Other symptoms and signs involving the musculoskeletal system: Secondary | ICD-10-CM | POA: Diagnosis not present

## 2023-10-31 DIAGNOSIS — Z9889 Other specified postprocedural states: Secondary | ICD-10-CM | POA: Diagnosis not present

## 2023-11-04 DIAGNOSIS — Z9889 Other specified postprocedural states: Secondary | ICD-10-CM | POA: Diagnosis not present

## 2023-11-04 DIAGNOSIS — M25512 Pain in left shoulder: Secondary | ICD-10-CM | POA: Diagnosis not present

## 2023-11-04 DIAGNOSIS — R29898 Other symptoms and signs involving the musculoskeletal system: Secondary | ICD-10-CM | POA: Diagnosis not present

## 2023-11-22 DIAGNOSIS — R29898 Other symptoms and signs involving the musculoskeletal system: Secondary | ICD-10-CM | POA: Diagnosis not present

## 2023-11-22 DIAGNOSIS — M25512 Pain in left shoulder: Secondary | ICD-10-CM | POA: Diagnosis not present

## 2023-11-22 DIAGNOSIS — Z9889 Other specified postprocedural states: Secondary | ICD-10-CM | POA: Diagnosis not present

## 2023-12-01 HISTORY — PX: ROTATOR CUFF REPAIR: SHX139

## 2023-12-06 DIAGNOSIS — M25512 Pain in left shoulder: Secondary | ICD-10-CM | POA: Diagnosis not present

## 2023-12-06 DIAGNOSIS — Z9889 Other specified postprocedural states: Secondary | ICD-10-CM | POA: Diagnosis not present

## 2023-12-06 DIAGNOSIS — R29898 Other symptoms and signs involving the musculoskeletal system: Secondary | ICD-10-CM | POA: Diagnosis not present

## 2023-12-13 DIAGNOSIS — M25512 Pain in left shoulder: Secondary | ICD-10-CM | POA: Diagnosis not present

## 2023-12-13 DIAGNOSIS — Z9889 Other specified postprocedural states: Secondary | ICD-10-CM | POA: Diagnosis not present

## 2023-12-13 DIAGNOSIS — R29898 Other symptoms and signs involving the musculoskeletal system: Secondary | ICD-10-CM | POA: Diagnosis not present

## 2023-12-20 DIAGNOSIS — R29898 Other symptoms and signs involving the musculoskeletal system: Secondary | ICD-10-CM | POA: Diagnosis not present

## 2023-12-20 DIAGNOSIS — Z9889 Other specified postprocedural states: Secondary | ICD-10-CM | POA: Diagnosis not present

## 2023-12-20 DIAGNOSIS — M25512 Pain in left shoulder: Secondary | ICD-10-CM | POA: Diagnosis not present

## 2024-01-02 DIAGNOSIS — Z9889 Other specified postprocedural states: Secondary | ICD-10-CM | POA: Diagnosis not present

## 2024-01-02 DIAGNOSIS — R29898 Other symptoms and signs involving the musculoskeletal system: Secondary | ICD-10-CM | POA: Diagnosis not present

## 2024-01-02 DIAGNOSIS — M25512 Pain in left shoulder: Secondary | ICD-10-CM | POA: Diagnosis not present

## 2024-01-12 ENCOUNTER — Encounter: Payer: Self-pay | Admitting: Internal Medicine

## 2024-01-12 ENCOUNTER — Ambulatory Visit (INDEPENDENT_AMBULATORY_CARE_PROVIDER_SITE_OTHER): Payer: Self-pay | Admitting: Internal Medicine

## 2024-01-12 VITALS — BP 126/74 | HR 52 | Ht 64.0 in | Wt 225.0 lb

## 2024-01-12 DIAGNOSIS — Z0000 Encounter for general adult medical examination without abnormal findings: Secondary | ICD-10-CM

## 2024-01-12 DIAGNOSIS — E785 Hyperlipidemia, unspecified: Secondary | ICD-10-CM

## 2024-01-12 DIAGNOSIS — R7303 Prediabetes: Secondary | ICD-10-CM | POA: Diagnosis not present

## 2024-01-12 DIAGNOSIS — M170 Bilateral primary osteoarthritis of knee: Secondary | ICD-10-CM

## 2024-01-12 DIAGNOSIS — Z23 Encounter for immunization: Secondary | ICD-10-CM | POA: Diagnosis not present

## 2024-01-12 DIAGNOSIS — Z1231 Encounter for screening mammogram for malignant neoplasm of breast: Secondary | ICD-10-CM | POA: Diagnosis not present

## 2024-01-12 DIAGNOSIS — I10 Essential (primary) hypertension: Secondary | ICD-10-CM

## 2024-01-12 DIAGNOSIS — Z13820 Encounter for screening for osteoporosis: Secondary | ICD-10-CM

## 2024-01-12 MED ORDER — HYDROCHLOROTHIAZIDE 25 MG PO TABS: 25.0000 mg | ORAL_TABLET | Freq: Every day | ORAL | 1 refills | Status: AC

## 2024-01-12 MED ORDER — ATORVASTATIN CALCIUM 10 MG PO TABS: 10.0000 mg | ORAL_TABLET | Freq: Every day | ORAL | 1 refills | Status: AC

## 2024-01-12 NOTE — Assessment & Plan Note (Addendum)
 Continue dietary efforts with low carb, low fat diet. Minimal exercise currently due to knee issues - recommend Ortho eval and possibly PTx Form signed to exercise at the [employer] center

## 2024-01-12 NOTE — Patient Instructions (Signed)
 Ms. Dworkin,  We hope you enjoyed your visit with our office! Your feedback means so much to our team, and it helps us  to continue providing the best care possible. If you had a positive experience, we'd love if you could share it by leaving us  a [employer] Review and also completing our patient survey that you'll receive soon.  Your kind words not only brighten our day but also help other patients feel confident in choosing our office for their care.  Thank you for being a part of our practice family!   Eual Grieves, CMA & Dr Leita Adie, MD [facility] [place] [address]  [place] [address] Office [phone]  Fax: [phone]'

## 2024-01-12 NOTE — Progress Notes (Signed)
 Date:  01/12/2024   Name:  Alicia Andersen   DOB:  09-07-1955   MRN:  [id number]   Chief Complaint: Annual Exam Alicia Andersen is a 68 y.o. female who presents today for her Complete Annual Exam. She feels fairly well. She reports exercising. She reports she is sleeping poorly. Breast complaints - none. Health Maintenance  Topic Date Due   Medicare Annual Wellness Visit  Never done   Zoster (Shingles) Vaccine (1 of 2) Never done   DEXA scan (bone density measurement)  Never done   COVID-19 Vaccine (5 - 2025-26 season) 11/14/2023   Breast Cancer Screening  01/26/2024   Colon Cancer Screening  07/27/2032   Pneumococcal Vaccine for age over 74  Completed   Flu Shot  Completed   Hepatitis C Screening  Completed   Meningitis B Vaccine  Aged Out   DTaP/Tdap/Td vaccine  Discontinued   Hypertension This is a chronic problem. The problem is controlled. Pertinent negatives include no chest pain, headaches, palpitations or shortness of breath. Past treatments include diuretics. There is no history of kidney disease, CAD/MI or CVA.  Hyperlipidemia This is a chronic problem. Recent lipid tests were reviewed and are high. Pertinent negatives include no chest pain, myalgias or shortness of breath. Current antihyperlipidemic treatment includes statins. The current treatment provides moderate improvement of lipids.  Knee Pain  There was no injury mechanism. The pain is present in the left knee and right knee. The quality of the pain is described as burning and aching. The pain is moderate. The pain has been Fluctuating since onset. Associated symptoms include muscle weakness (and balance issues). Pertinent negatives include no numbness or tingling. Treatments tried: seen by Ortho for joint injections. The treatment provided moderate (now worn off) relief.    Review of Systems  Constitutional:  Negative for fatigue and unexpected weight change.  HENT:  Negative for hearing loss and trouble  swallowing.   Eyes:  Negative for visual disturbance.  Respiratory:  Negative for cough, chest tightness, shortness of breath and wheezing.   Cardiovascular:  Negative for chest pain, palpitations and leg swelling.  Gastrointestinal:  Negative for abdominal pain, constipation and diarrhea.  Genitourinary:  Negative for dysuria and hematuria.  Musculoskeletal:  Positive for arthralgias and gait problem. Negative for myalgias.  Neurological:  Negative for dizziness, tingling, weakness, light-headedness, numbness and headaches.  Psychiatric/Behavioral:  Negative for dysphoric mood and sleep disturbance. The patient is not nervous/anxious.      Lab Results  Component Value Date   NA 143 06/29/2023   K 3.7 06/29/2023   CO2 26 06/29/2023   GLUCOSE 113 (H) 06/29/2023   BUN 10 06/29/2023   CREATININE 0.77 06/29/2023   CALCIUM  10.4 (H) 06/29/2023   EGFR 85 06/29/2023   GFRNONAA 71 12/26/2019   Lab Results  Component Value Date   CHOL 237 (H) 01/10/2023   HDL 75 01/10/2023   LDLCALC 145 (H) 01/10/2023   TRIG 100 01/10/2023   CHOLHDL 3.2 01/10/2023   Lab Results  Component Value Date   TSH 1.880 01/10/2023   Lab Results  Component Value Date   HGBA1C 6.2 (H) 06/29/2023   Lab Results  Component Value Date   WBC 7.6 01/10/2023   HGB 13.5 01/10/2023   HCT 42.6 01/10/2023   MCV 89 01/10/2023   PLT 256 01/10/2023   Lab Results  Component Value Date   ALT 13 06/29/2023   AST 13 06/29/2023   ALKPHOS 124 (H) 06/29/2023  BILITOT 0.6 06/29/2023   No results found for: MARIEN BOLLS, VD25OH   Patient Active Problem List   Diagnosis Date Noted   Morbid obesity (HCC) 01/12/2024   Arthritis of both ankles 06/29/2023   Rotator cuff tear arthropathy of right shoulder 06/29/2023   REM sleep behavior disorder 06/29/2023   Mild hyperlipidemia 01/05/2022   Spondylosis of lumbar region without myelopathy or radiculopathy 12/04/2019   Vertigo 10/01/2016   Ankle edema  04/11/2015   Plantar fasciitis 12/31/2014   Aortic atherosclerosis 12/31/2014   Essential (primary) hypertension 12/31/2014   Arthralgia of hip 12/31/2014   Prediabetes 12/31/2014   Migraine without aura and responsive to treatment 12/31/2014   Arthritis of knee, degenerative 12/31/2014   Periodic limb movement 12/31/2014   Apnea, sleep 12/31/2014    No Known Allergies  Past Surgical History:  Procedure Laterality Date   CATARACT EXTRACTION Left 2016   ENDOMETRIAL BIOPSY  2005   benign   KNEE ARTHROSCOPY Right 2009   ROTATOR CUFF REPAIR Bilateral 12/01/2023   TEMPORAL ARTERY BIOPSY / LIGATION  05/2013   negative    Social History   Tobacco Use   Smoking status: Never   Smokeless tobacco: Never  Vaping Use   Vaping status: Never Used  Substance Use Topics   Alcohol use: No    Alcohol/week: 0.0 standard drinks of alcohol     Medication list has been reviewed and updated.  Current Meds  Medication Sig   acetaminophen (TYLENOL) 500 MG tablet Take 1,000 mg by mouth every 6 (six) hours as needed for moderate pain.   aspirin EC 81 MG tablet Take 81 mg by mouth daily.   clonazePAM  (KLONOPIN ) 0.5 MG tablet Take 1 tablet (0.5 mg total) by mouth at bedtime.   fexofenadine  (ALLEGRA  ALLERGY) 180 MG tablet Take 1 tablet (180 mg total) by mouth daily.   fluticasone  (FLONASE ) 50 MCG/ACT nasal spray Place 2 sprays into both nostrils daily.   ibuprofen  (ADVIL ) 600 MG tablet TAKE 1 TABLET BY MOUTH EVERY 8 HOURS AS NEEDED   traMADol  (ULTRAM ) 50 MG tablet Take 50 mg by mouth every 6 (six) hours as needed.   [DISCONTINUED] atorvastatin  (LIPITOR) 10 MG tablet Take 1 tablet (10 mg total) by mouth daily.   [DISCONTINUED] hydrochlorothiazide  (HYDRODIURIL ) 25 MG tablet Take 1 tablet (25 mg total) by mouth daily.       01/12/2024    8:06 AM 09/28/2023   10:41 AM 06/29/2023    1:55 PM 01/10/2023    8:09 AM  GAD 7 : Generalized Anxiety Score  Nervous, Anxious, on Edge 1 1 1 3    Control/stop worrying 1 1 1 3   Worry too much - different things 1 1 0 3  Trouble relaxing 0 2 0 3  Restless 0 1 0 0  Easily annoyed or irritable 0 1 0 0  Afraid - awful might happen 0 2 0 3  Total GAD 7 Score 3 9 2 15   Anxiety Difficulty Somewhat difficult Somewhat difficult Not difficult at all Very difficult       01/12/2024    8:06 AM 09/28/2023   10:40 AM 06/29/2023    1:55 PM  Depression screen PHQ 2/9  Decreased Interest 0 2 1  Down, Depressed, Hopeless 0 1 1  PHQ - 2 Score 0 3 2  Altered sleeping 3 3 1   Tired, decreased energy 3 3 0  Change in appetite 0 0 0  Feeling bad or failure about yourself  0 0 1  Trouble concentrating 0 2 0  Moving slowly or fidgety/restless 0 1 0  Suicidal thoughts 0 0   PHQ-9 Score 6 12 4   Difficult doing work/chores Not difficult at all Somewhat difficult     BP Readings from Last 3 Encounters:  01/12/24 126/74  09/28/23 126/84  06/29/23 122/80    Physical Exam Vitals and nursing note reviewed.  Constitutional:      General: She is not in acute distress.    Appearance: She is well-developed.  HENT:     Head: Normocephalic and atraumatic.     Right Ear: Tympanic membrane and ear canal normal.     Left Ear: Tympanic membrane and ear canal normal.     Nose:     Right Sinus: No maxillary sinus tenderness.     Left Sinus: No maxillary sinus tenderness.  Eyes:     General: No scleral icterus.       Right eye: No discharge.        Left eye: No discharge.     Conjunctiva/sclera: Conjunctivae normal.  Neck:     Thyroid: No thyromegaly.     Vascular: No carotid bruit.  Cardiovascular:     Rate and Rhythm: Normal rate and regular rhythm.     Pulses: Normal pulses.     Heart sounds: Normal heart sounds.  Pulmonary:     Effort: Pulmonary effort is normal. No respiratory distress.     Breath sounds: No wheezing.  Abdominal:     General: Bowel sounds are normal.     Palpations: Abdomen is soft.     Tenderness: There is no  abdominal tenderness.  Musculoskeletal:     Cervical back: Normal range of motion. No erythema.     Right knee: Bony tenderness present. No swelling. Decreased range of motion.     Left knee: Bony tenderness present. No swelling. Decreased range of motion.     Right lower leg: No edema.     Left lower leg: No edema.  Lymphadenopathy:     Cervical: No cervical adenopathy.  Skin:    General: Skin is warm and dry.     Capillary Refill: Capillary refill takes less than 2 seconds.     Findings: No rash.  Neurological:     General: No focal deficit present.     Mental Status: She is alert and oriented to person, place, and time.     Cranial Nerves: No cranial nerve deficit.     Sensory: No sensory deficit.     Deep Tendon Reflexes: Reflexes are normal and symmetric.  Psychiatric:        Attention and Perception: Attention normal.        Mood and Affect: Mood normal.     Wt Readings from Last 3 Encounters:  01/12/24 225 lb (102.1 kg)  09/28/23 229 lb (103.9 kg)  06/29/23 229 lb (103.9 kg)    BP 126/74   Pulse (!) 52   Ht 5' 4 (1.626 m)   Wt 225 lb (102.1 kg)   SpO2 94%   BMI 38.62 kg/m   Assessment and Plan:  Problem List Items Addressed This Visit       Unprioritized   Essential (primary) hypertension (Chronic)   Well controlled blood pressure today. Current regimen is maxzide. No medication side effects noted.        Relevant Medications   aspirin EC 81 MG tablet   hydrochlorothiazide  (HYDRODIURIL ) 25 MG tablet   atorvastatin  (LIPITOR) 10 MG tablet  Other Relevant Orders   CBC with Differential/Platelet   Comprehensive metabolic panel with GFR   TSH   Urinalysis, Routine w reflex microscopic   Prediabetes (Chronic)   Managed with diet only      Relevant Orders   Hemoglobin A1c   Arthritis of knee, degenerative   More aching and throbbing recently. Has seen [facility] and had injection but that had worn off. Also noticing more balance issues - recommend  asking [facility] about PTx      Relevant Medications   traMADol  (ULTRAM ) 50 MG tablet   aspirin EC 81 MG tablet   Mild hyperlipidemia (Chronic)   Relevant Medications   aspirin EC 81 MG tablet   hydrochlorothiazide  (HYDRODIURIL ) 25 MG tablet   atorvastatin  (LIPITOR) 10 MG tablet   Other Relevant Orders   Lipid panel   Morbid obesity (HCC)   Continue dietary efforts with low carb, low fat diet. Minimal exercise currently due to knee issues - recommend Ortho eval and possibly PTx Form signed to exercise at the [employer] [facility]      Other Visit Diagnoses       Annual physical exam    -  Primary     Encounter for screening mammogram for breast cancer       ordered at [facility]     Encounter for screening for osteoporosis       ordered at [facility]     Encounter for immunization       Relevant Orders   Flu vaccine HIGH DOSE PF(Fluzone Trivalent) (Completed)       Return in about 6 months (around 07/12/2024) for HTN  Dr. LOIS Leita HILARIO Justus, MD [facility] [place]

## 2024-01-12 NOTE — Assessment & Plan Note (Signed)
Managed with diet only.

## 2024-01-12 NOTE — Assessment & Plan Note (Signed)
 Well controlled blood pressure today. Current regimen is maxzide. No medication side effects noted.

## 2024-01-12 NOTE — Assessment & Plan Note (Signed)
 More aching and throbbing recently. Has seen [facility] and had injection but that had worn off. Also noticing more balance issues - recommend asking [facility] about PTx

## 2024-01-13 ENCOUNTER — Ambulatory Visit: Payer: Self-pay | Admitting: Internal Medicine

## 2024-01-13 DIAGNOSIS — E782 Mixed hyperlipidemia: Secondary | ICD-10-CM

## 2024-01-13 LAB — CBC WITH DIFFERENTIAL/PLATELET
Basophils Absolute: 0 x10E3/uL (ref 0.0–0.2)
Basos: 0 %
EOS (ABSOLUTE): 0.2 x10E3/uL (ref 0.0–0.4)
Eos: 3 %
Hematocrit: 40.8 % (ref 34.0–46.6)
Hemoglobin: 12.9 g/dL (ref 11.1–15.9)
Immature Grans (Abs): 0 x10E3/uL (ref 0.0–0.1)
Immature Granulocytes: 0 %
Lymphocytes Absolute: 2.3 x10E3/uL (ref 0.7–3.1)
Lymphs: 31 %
MCH: 27.7 pg (ref 26.6–33.0)
MCHC: 31.6 g/dL (ref 31.5–35.7)
MCV: 88 fL (ref 79–97)
Monocytes Absolute: 0.6 x10E3/uL (ref 0.1–0.9)
Monocytes: 9 %
Neutrophils Absolute: 4.1 x10E3/uL (ref 1.4–7.0)
Neutrophils: 57 %
Platelets: 250 x10E3/uL (ref 150–450)
RBC: 4.65 x10E6/uL (ref 3.77–5.28)
RDW: 13.1 % (ref 11.7–15.4)
WBC: 7.2 x10E3/uL (ref 3.4–10.8)

## 2024-01-13 LAB — COMPREHENSIVE METABOLIC PANEL WITH GFR
ALT: 9 IU/L (ref 0–32)
AST: 9 IU/L (ref 0–40)
Albumin: 4.4 g/dL (ref 3.9–4.9)
Alkaline Phosphatase: 127 IU/L (ref 49–135)
BUN/Creatinine Ratio: 10 — ABNORMAL LOW (ref 12–28)
BUN: 9 mg/dL (ref 8–27)
Bilirubin Total: 0.7 mg/dL (ref 0.0–1.2)
CO2: 23 mmol/L (ref 20–29)
Calcium: 10.4 mg/dL — ABNORMAL HIGH (ref 8.7–10.3)
Chloride: 104 mmol/L (ref 96–106)
Creatinine, Ser: 0.87 mg/dL (ref 0.57–1.00)
Globulin, Total: 2.7 g/dL (ref 1.5–4.5)
Glucose: 103 mg/dL — ABNORMAL HIGH (ref 70–99)
Potassium: 4 mmol/L (ref 3.5–5.2)
Sodium: 144 mmol/L (ref 134–144)
Total Protein: 7.1 g/dL (ref 6.0–8.5)
eGFR: 73 mL/min/1.73 (ref >59)

## 2024-01-13 LAB — LIPID PANEL
Chol/HDL Ratio: 3.8 ratio (ref 0.0–4.4)
Cholesterol, Total: 273 mg/dL — ABNORMAL HIGH (ref 100–199)
HDL: 71 mg/dL (ref >39)
LDL Chol Calc (NIH): 186 mg/dL — ABNORMAL HIGH (ref 0–99)
Triglycerides: 97 mg/dL (ref 0–149)
VLDL Cholesterol Cal: 16 mg/dL (ref 5–40)

## 2024-01-13 LAB — URINALYSIS, ROUTINE W REFLEX MICROSCOPIC
Bilirubin, UA: NEGATIVE
Glucose, UA: NEGATIVE
Ketones, UA: NEGATIVE
Leukocytes,UA: NEGATIVE
Nitrite, UA: NEGATIVE
Protein,UA: NEGATIVE
RBC, UA: NEGATIVE
Specific Gravity, UA: 1.016 (ref 1.005–1.030)
Urobilinogen, Ur: 0.2 mg/dL (ref 0.2–1.0)
pH, UA: 6.5 (ref 5.0–7.5)

## 2024-01-13 LAB — HEMOGLOBIN A1C
Est. average glucose Bld gHb Est-mCnc: 131 mg/dL
Hgb A1c MFr Bld: 6.2 % — ABNORMAL HIGH (ref 4.8–5.6)

## 2024-01-13 LAB — TSH: TSH: 2.84 u[IU]/mL (ref 0.450–4.500)

## 2024-01-24 ENCOUNTER — Other Ambulatory Visit: Payer: Self-pay

## 2024-01-24 DIAGNOSIS — M545 Low back pain, unspecified: Secondary | ICD-10-CM

## 2024-01-24 MED ORDER — IBUPROFEN 600 MG PO TABS: 600.0000 mg | ORAL_TABLET | Freq: Three times a day (TID) | ORAL | 0 refills | Status: AC | PRN

## 2024-03-09 LAB — HM MAMMOGRAPHY

## 2024-03-26 ENCOUNTER — Ambulatory Visit: Payer: Self-pay

## 2024-03-26 NOTE — Telephone Encounter (Signed)
 FYI Only or Action Required?: FYI only for provider: appointment scheduled on 1/13.  Patient was last seen in primary care on 01/12/2024 by Justus Leita DEL, MD.  Called Nurse Triage reporting Sore Throat.  Symptoms began a week ago.  Interventions attempted: OTC medications: tylenol.  Symptoms are: gradually worsening.  Triage Disposition: See PCP When Office is Open (Within 3 Days)  Patient/caregiver understands and will follow disposition?: Yes, will follow disposition  Copied from CRM [id number]. Topic: Clinical - Red Word Triage >> Mar 26, 2024  4:18 PM Fonda T wrote: Red Word that prompted transfer to Nurse Triage: Pt calling reports she is losing voice, difficulty/pain with swallowing, running fevers, taking Tylenol, runny nose, nasal congestion with productive cough and colored mucous.   Pt requested appt for evaluation. Reason for Disposition  [1] Sore throat with cough/cold symptoms AND [2] present > 5 days  Answer Assessment - Initial Assessment Questions 1. ONSET: When did the throat start hurting? (Hours or days ago)      2nd week 2. SEVERITY: How bad is the sore throat? (Scale 1-10; mild, moderate or severe)     7 3. STREP EXPOSURE: Has there been any exposure to strep within the past week? If Yes, ask: What type of contact occurred?      denies 4.  VIRAL SYMPTOMS: Are there any symptoms of a cold, such as a runny nose, cough, hoarse voice or red eyes?      Cough, hoarse, runny nose 5. FEVER: Do you have a fever? If Yes, ask: What is your temperature, how was it measured, and when did it start?     Unsure, but states that she has had a fever 6. PUS ON THE TONSILS: Is there pus on the tonsils in the back of your throat? White per family 7. OTHER SYMPTOMS: Do you have any other symptoms? (e.g., difficulty breathing, headache, rash)     Denies difficulty breathing  Protocols used: Sore Throat-A-AH

## 2024-03-27 ENCOUNTER — Ambulatory Visit: Admitting: Family Medicine

## 2024-03-27 ENCOUNTER — Encounter: Payer: Self-pay | Admitting: Family Medicine

## 2024-03-27 VITALS — BP 130/78 | HR 80 | Ht 64.0 in | Wt 220.0 lb

## 2024-03-27 DIAGNOSIS — J029 Acute pharyngitis, unspecified: Secondary | ICD-10-CM | POA: Diagnosis not present

## 2024-03-27 LAB — POCT RAPID STREP A: Rapid Strep A Screen: NEGATIVE

## 2024-03-27 MED ORDER — METHYLPREDNISOLONE 4 MG PO TBPK: ORAL_TABLET | ORAL | 0 refills | Status: AC

## 2024-03-27 MED ORDER — AMOXICILLIN-POT CLAVULANATE 875-125 MG PO TABS: 1.0000 | ORAL_TABLET | Freq: Two times a day (BID) | ORAL | 0 refills | Status: AC

## 2024-03-27 NOTE — Progress Notes (Signed)
" ° °  Acute Office Visit  Subjective:     Patient ID: Alicia Andersen, female    DOB: August 13, 1955, 69 y.o.   MRN: [id number]  Chief Complaint  Patient presents with   Hoarse    2 weeks, taken tylenol cold and flu, mucenix, cough drops     Discussed the use of AI scribe software for clinical note transcription with the patient, who gave verbal consent to proceed.  History of Present Illness Alicia Andersen is a 69 year old female who presents with persistent upper respiratory symptoms for two weeks.  She has been experiencing coughing, sneezing, and rhinorrhea for the past two weeks. Over-the-counter medications including Tylenol, Mucinex , and Advil  have not alleviated her symptoms. Rhinorrhea worsens when she bends her head down.  She denies having a recorded fever but mentions feeling 'numb'. She experiences throat pain, voice changes, and difficulty swallowing. Occasionally, she expectorates mucus when coughing, but not consistently. No chest pain or shortness of breath.     Review of Systems  All other systems reviewed and are negative.       Objective:    Ht 5' 4 (1.626 m)   Wt 220 lb (99.8 kg)   BMI 37.76 kg/m     Physical Exam Vitals and nursing note reviewed.  Constitutional:      Appearance: Normal appearance.  HENT:     Head: Normocephalic.     Right Ear: External ear normal.     Left Ear: External ear normal.  Eyes:     Conjunctiva/sclera: Conjunctivae normal.  Cardiovascular:     Rate and Rhythm: Normal rate.  Pulmonary:     Effort: Pulmonary effort is normal. No respiratory distress.  Abdominal:     Palpations: Abdomen is soft.  Musculoskeletal:        General: Normal range of motion.  Skin:    General: Skin is warm.  Neurological:     Mental Status: She is alert and oriented to person, place, and time.  Psychiatric:        Mood and Affect: Mood normal.     No results found for any visits on 03/27/24.      Assessment & Plan:   Assessment  & Plan Sorethroat  Orders:   amoxicillin -clavulanate (AUGMENTIN ) 875-125 MG tablet; Take 1 tablet by mouth 2 (two) times daily for 7 days.   methylPREDNISolone  (MEDROL  DOSEPAK) 4 MG TBPK tablet; Use as directed.   Assessment and Plan Assessment & Plan Acute pharyngitis Bacterial infection considered due to duration and severity. - Prescribed antibiotics for 7 days. - Recommended warm salt water gargles and honey tea. - Advised use of Cepacol throat spray. - Prescribed Medrol  Dosepak. - Instructed to take Tylenol or Advil  as needed.     No follow-ups on file.  Nykole Matos K Aleene Swanner, MD  "

## 2024-07-12 ENCOUNTER — Ambulatory Visit: Admitting: Family Medicine
